# Patient Record
Sex: Female | Born: 1955
Health system: Southern US, Community
[De-identification: ages and names within clinical notes are randomized; demographics above are authoritative.]

## PROBLEM LIST (undated history)

## (undated) ENCOUNTER — Emergency Department (HOSPITAL_COMMUNITY): Discharge status: Absent without leave | Payer: Self-pay

## (undated) DIAGNOSIS — F988 Other specified behavioral and emotional disorders with onset usually occurring in childhood and adolescence: Secondary | ICD-10-CM

## (undated) DIAGNOSIS — B977 Papillomavirus as the cause of diseases classified elsewhere: Secondary | ICD-10-CM

## (undated) DIAGNOSIS — M1990 Unspecified osteoarthritis, unspecified site: Secondary | ICD-10-CM

## (undated) DIAGNOSIS — N390 Urinary tract infection, site not specified: Secondary | ICD-10-CM

## (undated) HISTORY — DX: Papillomavirus as the cause of diseases classified elsewhere: B97.7

## (undated) HISTORY — PX: ABDOMINAL HYSTERECTOMY: SHX81

## (undated) HISTORY — PX: NASAL SINUS SURGERY: SHX719

## (undated) HISTORY — PX: COMBINED HYSTEROSCOPY DIAGNOSTIC / D&C: SUR297

## (undated) HISTORY — PX: NASAL SEPTOPLASTY W/ TURBINOPLASTY: SHX2070

## (undated) HISTORY — DX: Unspecified osteoarthritis, unspecified site: M19.90

## (undated) HISTORY — DX: Other specified behavioral and emotional disorders with onset usually occurring in childhood and adolescence: F98.8

## (undated) HISTORY — PX: COLONOSCOPY: SHX174

---

## 1984-03-01 HISTORY — PX: TOTAL VAGINAL HYSTERECTOMY: SHX2548

## 1984-03-01 HISTORY — PX: ABDOMINAL HYSTERECTOMY: SHX81

## 1990-03-01 HISTORY — PX: NASAL SINUS SURGERY: SHX719

## 2015-05-30 ENCOUNTER — Emergency Department (INDEPENDENT_AMBULATORY_CARE_PROVIDER_SITE_OTHER)
Admission: EM | Admit: 2015-05-30 | Discharge: 2015-05-30 | Disposition: A | Discharge status: Home / Self Care | Payer: BLUE CROSS/BLUE SHIELD | Source: Home / Self Care | Attending: Emergency Medicine | Admitting: Emergency Medicine

## 2015-05-30 ENCOUNTER — Encounter (HOSPITAL_COMMUNITY): Payer: Self-pay | Admitting: Emergency Medicine

## 2015-05-30 DIAGNOSIS — N390 Urinary tract infection, site not specified: Secondary | ICD-10-CM | POA: Diagnosis not present

## 2015-05-30 HISTORY — DX: Urinary tract infection, site not specified: N39.0

## 2015-05-30 LAB — POCT URINALYSIS DIP (DEVICE)
Bilirubin Urine: NEGATIVE
Glucose, UA: NEGATIVE mg/dL
Ketones, ur: NEGATIVE mg/dL
NITRITE: POSITIVE — AB
PH: 6 (ref 5.0–8.0)
Specific Gravity, Urine: >=1.03 (ref 1.005–1.030)
Urobilinogen, UA: 0.2 mg/dL (ref 0.0–1.0)

## 2015-05-30 MED ORDER — CEPHALEXIN 500 MG PO CAPS: 500.0000 mg | ORAL_CAPSULE | Freq: Four times a day (QID) | ORAL | Status: DC

## 2015-05-30 NOTE — ED Notes (Signed)
Onset of symptoms 3/25.  Patient has a history of uti.  Low abdominal pain.  Feeling bladder full, but only a small amount of urine.  Initially right low back pain , now bilateral back pain.  Burning with urination. Pelvic pain has worsened

## 2015-05-30 NOTE — Discharge Instructions (Signed)
Antibiotic Medicine °Antibiotic medicines are used to treat infections caused by bacteria. They work by injuring or killing the bacteria that is making you sick. °HOW IS AN ANTIBIOTIC CHOSEN? °An antibiotic is chosen based on many factors. To help your health care provider choose one for you, tell your health care provider if: °· You have any allergies. °· You are pregnant or plan to get pregnant. °· You are breastfeeding. °· You are taking any medicines. These include over-the-counter medicines, prescription medicines, and herbal remedies. °· You have a medical condition or problem you have not already discussed. °Your health care provider will also consider: °· How often the medicine has to be taken. °· Common side effects of the medicine. °· The cost of the medicine. °· The taste of the medicine. °If you have questions about why an antibiotic was chosen, make sure to ask. °FOR HOW LONG SHOULD I TAKE MY ANTIBIOTIC? °Continue to take your antibiotic for as long as told by your health care provider. Do not stop taking it when you feel better. If you stop taking it too soon: °· You may start to feel sick again. °· Your infection may become harder to treat. °· Complications may develop. °WHAT IF I MISS A DOSE? °Try not to miss any doses of medicine. If you miss a dose, take it as soon as possible. However, if it is almost time for the next dose: °· If you are taking 2 doses per day, take the missed dose and the next dose 5 to 6 hours apart. °· If you are taking 3 or more doses per day, take the missed dose and the next dose 2 to 4 hours apart, then go back to the normal schedule. °If you cannot make up a missed dose, take the next scheduled dose on time. Then take the missed dose after you have taken all the doses as recommended by your health care provider, as if you had one more dose left. °DO ANTIBIOTICS AFFECT BIRTH CONTROL? °Birth control pills may not work while you are on antibiotics. If you are taking birth  control pills, continue taking them as usual and use a second form of birth control, such as a condom, to avoid unwanted pregnancy. Continue using the second form of birth control until you are finished with your current 1 month cycle of birth control pills. °OTHER INFORMATION °· If there is any medicine left over, throw it away. °· Never take someone else's antibiotics. °· Never take leftover antibiotics. °SEEK MEDICAL CARE IF: °· You get worse. °· You do not feel better within a few days of starting the antibiotic medicine. °· You vomit. °· White patches appear in your mouth. °· You have new joint pain that begins after starting the antibiotic. °· You have new muscle aches that begin after starting the antibiotic. °· You had a fever before starting the antibiotic and it returns. °· You have any symptoms of an allergic reaction, such as an itchy rash. If this happens, stop taking the antibiotic. °SEEK IMMEDIATE MEDICAL CARE IF: °· Your urine turns dark or becomes blood-colored. °· Your skin turns yellow. °· You bruise or bleed easily. °· You have severe diarrhea and abdominal cramps. °· You have a severe headache. °· You have signs of a severe allergic reaction, such as: °¨ Trouble breathing. °¨ Wheezing. °¨ Swelling of the lips, tongue, or face. °¨ Fainting. °¨ Blisters on the skin or in the mouth. °If you have signs of a severe allergic   reaction, stop taking the antibiotic right away. °  °This information is not intended to replace advice given to you by your health care provider. Make sure you discuss any questions you have with your health care provider. °  °Document Released: 10/29/2003 Document Revised: 11/06/2014 Document Reviewed: 07/03/2014 °Elsevier Interactive Patient Education ©2016 [employer] ° °Urinary Tract Infection °Urinary tract infections (UTIs) can develop anywhere along your urinary tract. Your urinary tract is your body's drainage system for removing wastes and extra water. Your urinary  tract includes two kidneys, two ureters, a bladder, and a urethra. Your kidneys are a pair of bean-shaped organs. Each kidney is about the size of your fist. They are located below your ribs, one on each side of your spine. °CAUSES °Infections are caused by microbes, which are microscopic organisms, including fungi, viruses, and bacteria. These organisms are so small that they can only be seen through a microscope. Bacteria are the microbes that most commonly cause UTIs. °SYMPTOMS  °Symptoms of UTIs may vary by age and gender of the patient and by the location of the infection. Symptoms in young women typically include a frequent and intense urge to urinate and a painful, burning feeling in the bladder or urethra during urination. Older women and men are more likely to be tired, shaky, and weak and have muscle aches and abdominal pain. A fever may mean the infection is in your kidneys. Other symptoms of a kidney infection include pain in your back or sides below the ribs, nausea, and vomiting. °DIAGNOSIS °To diagnose a UTI, your caregiver will ask you about your symptoms. Your caregiver will also ask you to provide a urine sample. The urine sample will be tested for bacteria and white blood cells. White blood cells are made by your body to help fight infection. °TREATMENT  °Typically, UTIs can be treated with medication. Because most UTIs are caused by a bacterial infection, they usually can be treated with the use of antibiotics. The choice of antibiotic and length of treatment depend on your symptoms and the type of bacteria causing your infection. °HOME CARE INSTRUCTIONS °· If you were prescribed antibiotics, take them exactly as your caregiver instructs you. Finish the medication even if you feel better after you have only taken some of the medication. °· Drink enough water and fluids to keep your urine clear or pale yellow. °· Avoid caffeine, tea, and carbonated beverages. They tend to irritate your  bladder. °· Empty your bladder often. Avoid holding urine for long periods of time. °· Empty your bladder before and after sexual intercourse. °· After a bowel movement, women should cleanse from front to back. Use each tissue only once. °SEEK MEDICAL CARE IF:  °· You have back pain. °· You develop a fever. °· Your symptoms do not begin to resolve within 3 days. °SEEK IMMEDIATE MEDICAL CARE IF:  °· You have severe back pain or lower abdominal pain. °· You develop chills. °· You have nausea or vomiting. °· You have continued burning or discomfort with urination. °MAKE SURE YOU:  °· Understand these instructions. °· Will watch your condition. °· Will get help right away if you are not doing well or get worse. °  °This information is not intended to replace advice given to you by your health care provider. Make sure you discuss any questions you have with your health care provider. °  °Document Released: 11/25/2004 Document Revised: 11/06/2014 Document Reviewed: 03/26/2011 °Elsevier Interactive Patient Education ©2016 [employer] Inc[employer] ° °

## 2015-05-30 NOTE — ED Provider Notes (Signed)
CSN: [id number]     Arrival date & time 05/30/15  1307 History   First MD Initiated Contact with Patient 05/30/15 1442     Chief Complaint  Patient presents with  . Urinary Tract Infection   (Consider location/radiation/quality/duration/timing/severity/associated sxs/prior Treatment) HPI Comments: 60 year old female complaining of a six-day history of urinary symptoms. Initially started with poor urinary flow and interrupted strain. Symptoms developing over the next few days include dysuria, suprapubic pain, urinary frequency, urgency. She also has very low back pain. She points to the superior aspect of the bilateral hips as a source of pain. Denies fever but she has had a couple days of nausea and vomiting with today being one of them.   Past Medical History  Diagnosis Date  . UTI (lower urinary tract infection)    Past Surgical History  Procedure Laterality Date  . Abdominal hysterectomy    . Nasal sinus surgery     No family history on file. Social History  Substance Use Topics  . Smoking status: Never Smoker   . Smokeless tobacco: None  . Alcohol Use: Yes   OB History    No data available     Review of Systems  Constitutional: Positive for chills, activity change and appetite change. Negative for fever.  HENT: Negative.   Respiratory: Negative.   Cardiovascular: Negative.   Gastrointestinal: Positive for nausea and vomiting.  Genitourinary: Positive for dysuria, urgency, frequency and hematuria. Negative for flank pain and vaginal discharge.       Suprapubic pain  Musculoskeletal: Positive for back pain.  Neurological: Negative.     Allergies  Review of patient's allergies indicates no known allergies.  Home Medications   Prior to Admission medications   Medication Sig Start Date End Date Taking? Authorizing Provider  cephALEXin (KEFLEX) 500 MG capsule Take 1 capsule (500 mg total) by mouth 4 (four) times daily. 05/30/15   Janne Napoleon, NP   Meds Ordered and  Administered this Visit  Medications - No data to display  BP 121/89 mmHg  Pulse 89  Temp(Src) 98 F (36.7 C) (Oral)  Resp 12  SpO2 98% No data found.   Physical Exam  Constitutional: She is oriented to person, place, and time. She appears well-developed and well-nourished. No distress.  Eyes: Conjunctivae and EOM are normal.  Neck: Normal range of motion. Neck supple.  Cardiovascular: Normal rate, regular rhythm and normal heart sounds.   Pulmonary/Chest: Effort normal and breath sounds normal. No respiratory distress.  Abdominal: Soft. There is no tenderness.  No tenderness across the abdomen, lower abdomen or suprapubic areas.  Musculoskeletal: She exhibits tenderness. She exhibits no edema.  Tenderness to the musculature over the posterior hips. The musculature involves the gluteus medius and maximus primarily.  Neurological: She is alert and oriented to person, place, and time. No cranial nerve deficit. She exhibits normal muscle tone.  Skin: Skin is warm and dry. No erythema.  Psychiatric: She has a normal mood and affect.  Nursing note and vitals reviewed.   ED Course  Procedures (including critical care time)  Labs Review Labs Reviewed  POCT URINALYSIS DIP (DEVICE) - Abnormal; Notable for the following:    Hgb urine dipstick LARGE (*)    Protein, ur >=300 (*)    Nitrite POSITIVE (*)    Leukocytes, UA LARGE (*)    All other components within normal limits   Results for orders placed or performed during the hospital encounter of 05/30/15  POCT urinalysis dip (device)  Result  Value Ref Range   Glucose, UA NEGATIVE NEGATIVE mg/dL   Bilirubin Urine NEGATIVE NEGATIVE   Ketones, ur NEGATIVE NEGATIVE mg/dL   Specific Gravity, Urine >=1.030 1.005 - 1.030   Hgb urine dipstick LARGE (A) NEGATIVE   pH 6.0 5.0 - 8.0   Protein, ur >=300 (A) NEGATIVE mg/dL   Urobilinogen, UA 0.2 0.0 - 1.0 mg/dL   Nitrite POSITIVE (A) NEGATIVE   Leukocytes, UA LARGE (A) NEGATIVE      Imaging Review No results found.   Visual Acuity Review  Right Eye Distance:   Left Eye Distance:   Bilateral Distance:    Right Eye Near:   Left Eye Near:    Bilateral Near:         MDM   1. UTI (lower urinary tract infection)     If you were prescribed antibiotics, take them exactly as your caregiver instructs you. Finish the medication even if you feel better after you have only taken some of the medication.  Drink enough water and fluids to keep your urine clear or pale yellow.  Avoid caffeine, tea, and carbonated beverages. They tend to irritate your bladder.  Empty your bladder often. Avoid holding urine for long periods of time.  Empty your bladder before and after sexual intercourse.  After a bowel movement, women should cleanse from front to back. Use each tissue only once. SEEK MEDICAL CARE IF:   You have back pain.  You develop a fever.  Your symptoms do not begin to resolve within 3 days. SEEK IMMEDIATE MEDICAL CARE IF:   You have severe back pain or lower abdominal pain.  You develop chills.  You have nausea or vomiting.  You have continued burning or discomfort with urination.  Meds ordered this encounter  Medications  . cephALEXin (KEFLEX) 500 MG capsule    Sig: Take 1 capsule (500 mg total) by mouth 4 (four) times daily.    Dispense:  28 capsule    Refill:  0    Order Specific Question:  Supervising Provider    Answer:  Melony Overly [phone]     Patient refused to have a urine culture due to cost.    Janne Napoleon, NP 05/30/15 1501

## 2017-04-27 DIAGNOSIS — Z7189 Other specified counseling: Secondary | ICD-10-CM | POA: Diagnosis not present

## 2017-04-27 DIAGNOSIS — Z23 Encounter for immunization: Secondary | ICD-10-CM | POA: Diagnosis not present

## 2017-10-14 DIAGNOSIS — H52223 Regular astigmatism, bilateral: Secondary | ICD-10-CM | POA: Diagnosis not present

## 2017-10-14 DIAGNOSIS — H524 Presbyopia: Secondary | ICD-10-CM | POA: Diagnosis not present

## 2017-10-14 DIAGNOSIS — H5213 Myopia, bilateral: Secondary | ICD-10-CM | POA: Diagnosis not present

## 2017-11-25 ENCOUNTER — Encounter: Payer: Self-pay | Admitting: Emergency Medicine

## 2017-11-25 ENCOUNTER — Emergency Department
Admission: EM | Admit: 2017-11-25 | Discharge: 2017-11-25 | Disposition: A | Discharge status: Home / Self Care | Payer: BLUE CROSS/BLUE SHIELD | Attending: Emergency Medicine | Admitting: Emergency Medicine

## 2017-11-25 ENCOUNTER — Emergency Department: Payer: BLUE CROSS/BLUE SHIELD

## 2017-11-25 ENCOUNTER — Other Ambulatory Visit: Payer: Self-pay

## 2017-11-25 DIAGNOSIS — M1712 Unilateral primary osteoarthritis, left knee: Secondary | ICD-10-CM | POA: Diagnosis not present

## 2017-11-25 DIAGNOSIS — M25462 Effusion, left knee: Secondary | ICD-10-CM

## 2017-11-25 DIAGNOSIS — M25562 Pain in left knee: Secondary | ICD-10-CM

## 2017-11-25 DIAGNOSIS — M25552 Pain in left hip: Secondary | ICD-10-CM | POA: Diagnosis not present

## 2017-11-25 NOTE — ED Notes (Addendum)
Pt unable to bear weight on right leg without causing intense pain. Pt felt popping sensation in knee when shopping. Pt able to stand and pivot onto stretcher from wheelchair. Some swelling visible on the outside of left knee, no bruising to the area

## 2017-11-25 NOTE — ED Triage Notes (Signed)
Pt presents to ED via POV with c/o L knee pain that started yesterday morning. Pt states went shopping earlier today and was standing in line when "it felt like a firecracker went off in my knee". Pt states walking/movement makes pain worse. Pt states dx with arthritis in R knee but not L knee.

## 2017-11-25 NOTE — ED Provider Notes (Signed)
[facility] Provider Note   CSN: [id number] Arrival date & time: 11/25/17  1615     History   Chief Complaint Chief Complaint  Patient presents with  . Knee Pain    HPI Caitlyn Thomas is a 62 y.o. female presents to the emergency department for evaluation of left knee pain.  Patient states earlier today she was shopping when she felt a pop in her left knee along the medial joint line.  She developed some instant pain and swelling throughout the knee but no pain or swelling throughout the lower leg.  Patient states since feeling the pop she feels as if the knee is unstable she has no pain with rest and moderate pain with attempted weightbearing.  She has not had x-rays of her left knee but no she is bone-on-bone in the right knee.  She admits to having some intermittent episodes of groin pain but no numbness tingling or radicular symptoms.  She is able to straight leg raise.  She has taken some ibuprofen with mild relief of her pain with weightbearing but still pain is moderate with standing.  Pain is sharp and located along the medial and posterior aspect of the knee.   HPI  Past Medical History:  Diagnosis Date  . UTI (lower urinary tract infection)     There are no active problems to display for this patient.   Past Surgical History:  Procedure Laterality Date  . ABDOMINAL HYSTERECTOMY    . NASAL SINUS SURGERY       OB History   None      Home Medications    Prior to Admission medications   Medication Sig Start Date End Date Taking? Authorizing Provider  cephALEXin (KEFLEX) 500 MG capsule Take 1 capsule (500 mg total) by mouth 4 (four) times daily. 05/30/15   Janne Napoleon, NP    Family History History reviewed. No pertinent family history.  Social History Social History   Tobacco Use  . Smoking status: Never Smoker  . Smokeless tobacco: Never Used  Substance Use Topics  . Alcohol use: Yes  . Drug use: No      Allergies   Patient has no known allergies.   Review of Systems Review of Systems  Constitutional: Negative for fever.  Respiratory: Negative for shortness of breath.   Cardiovascular: Negative for chest pain.  Gastrointestinal: Negative for abdominal pain.  Genitourinary: Negative for difficulty urinating, dysuria and urgency.  Musculoskeletal: Positive for arthralgias, gait problem and joint swelling. Negative for back pain and myalgias.  Skin: Negative for rash.  Neurological: Negative for dizziness and headaches.     Physical Exam Updated Vital Signs BP (!) 154/93 (BP Location: Right Arm)   Pulse 69   Temp 97.8 F (36.6 C) (Oral)   Resp 20   Ht 5\' 9"  (1.753 m)   Wt 108.9 kg   SpO2 97%   BMI 35.44 kg/m   Physical Exam  Constitutional: She is oriented to person, place, and time. She appears well-developed and well-nourished.  HENT:  Head: Normocephalic and atraumatic.  Eyes: Conjunctivae are normal.  Neck: Normal range of motion.  Cardiovascular: Normal rate.  Pulmonary/Chest: Effort normal. No respiratory distress.  Musculoskeletal: Normal range of motion.  Left lower extremity shows good range of motion of the hip with internal and external rotation with very mild groin pain.  She has no stiffness with hip internal and external rotation.  She has a negative straight leg raise test.  She is able to straight leg raise at the knee with no palpable defects in the quad or patellar tendon.  Knee is stable to valgus and varus stress testing but pain with varus stress testing.  She has pain with flexion past 90 degrees, mild effusion noted.  Small Baker's cyst noted.  She has a positive McMurray's test reproducing severe pain.  She has no tenderness throughout the calf with no swelling warmth erythema or edema in the lower extremity.  Neurological: She is alert and oriented to person, place, and time.  Skin: Skin is warm. No rash noted.  Psychiatric: She has a normal  mood and affect. Her behavior is normal. Thought content normal.     ED Treatments / Results  Labs (all labs ordered are listed, but only abnormal results are displayed) Labs Reviewed - No data to display  EKG None  Radiology Dg Knee Complete 4 Views Left  Result Date: 11/25/2017 CLINICAL DATA:  Pain.  No focal trauma reported. EXAM: LEFT KNEE - COMPLETE 4+ VIEW COMPARISON:  None. FINDINGS: Small joint effusion. Mild high attenuation projected over the suprapatellar space on the lateral view. Enthesopathic changes off the inferior patella. No acute fractures identified. Tricompartmental degenerative changes are mild-to-moderate. IMPRESSION: 1. Mild to moderate tricompartmental degenerative changes. 2. Small joint effusion. 3. Ill-defined high attenuation the suprapatellar space likely represent soft tissue calcification. Electronically Signed   By: Dorise Bullion III M.D   On: 11/25/2017 18:02   Dg Hip Unilat W Or Wo Pelvis 2-3 Views Left  Result Date: 11/25/2017 CLINICAL DATA:  Pain.  No focal trauma reported. EXAM: DG HIP (WITH OR WITHOUT PELVIS) 2-3V LEFT COMPARISON:  None. FINDINGS: There is no evidence of hip fracture or dislocation. There is no evidence of arthropathy or other focal bone abnormality. IMPRESSION: Negative. Electronically Signed   By: Dorise Bullion III M.D   On: 11/25/2017 18:03    Procedures Procedures (including critical care time)  Medications Ordered in ED Medications - No data to display   Initial Impression / Assessment and Plan / ED Course  I have reviewed the triage vital signs and the nursing notes.  Pertinent labs & imaging results that were available during my care of the patient were reviewed by me and considered in my medical decision making (see chart for details).     62 year old female with acute left knee pain.  She has a mild effusion with signs concerning for possible internal derangement.  Nonweightbearing x-rays show tricompartmental  osteoarthritis.  No sign of ligamentous laxity or tendon rupture.  No sign of DVT.  Patient is given Ace wrap for compression, she will rest ice and elevate.  She is given crutches to help with ambulation.  She will call orthopedic office Monday morning to schedule follow-up appointment.  Final Clinical Impressions(s) / ED Diagnoses   Final diagnoses:  Effusion of left knee  Primary osteoarthritis of left knee  Mechanical pain of left knee    ED Discharge Orders    None       Renata Caprice 11/25/17 1847    Delman Kitten, MD 11/26/17 [phone]

## 2017-11-25 NOTE — Discharge Instructions (Addendum)
Please rest ice and elevate the left knee.  Use Ace wrap as needed.  Please use crutches as needed for ambulation.  You may take Tylenol and/or ibuprofen as needed for pain.  Call orthopedic office Monday morning schedule follow-up appointment.

## 2017-12-07 DIAGNOSIS — M25562 Pain in left knee: Secondary | ICD-10-CM | POA: Diagnosis not present

## 2018-01-05 DIAGNOSIS — M25562 Pain in left knee: Secondary | ICD-10-CM | POA: Diagnosis not present

## 2018-01-13 DIAGNOSIS — M25562 Pain in left knee: Secondary | ICD-10-CM | POA: Diagnosis not present

## 2018-01-20 DIAGNOSIS — M25562 Pain in left knee: Secondary | ICD-10-CM | POA: Diagnosis not present

## 2018-01-29 HISTORY — PX: REPLACEMENT TOTAL KNEE: SUR1224

## 2018-02-09 DIAGNOSIS — M25562 Pain in left knee: Secondary | ICD-10-CM | POA: Diagnosis not present

## 2018-02-09 DIAGNOSIS — Z01818 Encounter for other preprocedural examination: Secondary | ICD-10-CM | POA: Diagnosis not present

## 2018-02-09 DIAGNOSIS — M1712 Unilateral primary osteoarthritis, left knee: Secondary | ICD-10-CM | POA: Diagnosis not present

## 2018-02-10 DIAGNOSIS — M1712 Unilateral primary osteoarthritis, left knee: Secondary | ICD-10-CM | POA: Diagnosis not present

## 2018-02-10 DIAGNOSIS — Z01818 Encounter for other preprocedural examination: Secondary | ICD-10-CM | POA: Diagnosis not present

## 2018-02-10 DIAGNOSIS — K219 Gastro-esophageal reflux disease without esophagitis: Secondary | ICD-10-CM | POA: Diagnosis not present

## 2018-02-10 DIAGNOSIS — R9431 Abnormal electrocardiogram [ECG] [EKG]: Secondary | ICD-10-CM | POA: Diagnosis not present

## 2018-02-23 DIAGNOSIS — G8918 Other acute postprocedural pain: Secondary | ICD-10-CM | POA: Diagnosis not present

## 2018-02-23 DIAGNOSIS — Z79899 Other long term (current) drug therapy: Secondary | ICD-10-CM | POA: Diagnosis not present

## 2018-02-23 DIAGNOSIS — M1712 Unilateral primary osteoarthritis, left knee: Secondary | ICD-10-CM | POA: Diagnosis not present

## 2018-02-23 DIAGNOSIS — K219 Gastro-esophageal reflux disease without esophagitis: Secondary | ICD-10-CM | POA: Diagnosis not present

## 2018-02-23 DIAGNOSIS — Z96652 Presence of left artificial knee joint: Secondary | ICD-10-CM | POA: Diagnosis not present

## 2018-02-23 DIAGNOSIS — Z6835 Body mass index (BMI) 35.0-35.9, adult: Secondary | ICD-10-CM | POA: Diagnosis not present

## 2018-02-23 DIAGNOSIS — E669 Obesity, unspecified: Secondary | ICD-10-CM | POA: Diagnosis not present

## 2018-02-23 DIAGNOSIS — Z471 Aftercare following joint replacement surgery: Secondary | ICD-10-CM | POA: Diagnosis not present

## 2018-02-23 DIAGNOSIS — Z87891 Personal history of nicotine dependence: Secondary | ICD-10-CM | POA: Diagnosis not present

## 2018-02-27 DIAGNOSIS — M25562 Pain in left knee: Secondary | ICD-10-CM | POA: Diagnosis not present

## 2018-03-03 DIAGNOSIS — M25562 Pain in left knee: Secondary | ICD-10-CM | POA: Diagnosis not present

## 2018-03-06 DIAGNOSIS — M25562 Pain in left knee: Secondary | ICD-10-CM | POA: Diagnosis not present

## 2018-03-09 DIAGNOSIS — M25562 Pain in left knee: Secondary | ICD-10-CM | POA: Diagnosis not present

## 2018-03-13 DIAGNOSIS — M25562 Pain in left knee: Secondary | ICD-10-CM | POA: Diagnosis not present

## 2018-03-16 DIAGNOSIS — M25562 Pain in left knee: Secondary | ICD-10-CM | POA: Diagnosis not present

## 2018-03-20 DIAGNOSIS — M25562 Pain in left knee: Secondary | ICD-10-CM | POA: Diagnosis not present

## 2018-03-23 DIAGNOSIS — M25562 Pain in left knee: Secondary | ICD-10-CM | POA: Diagnosis not present

## 2018-03-27 DIAGNOSIS — M25562 Pain in left knee: Secondary | ICD-10-CM | POA: Diagnosis not present

## 2018-04-11 DIAGNOSIS — M25562 Pain in left knee: Secondary | ICD-10-CM | POA: Diagnosis not present

## 2018-04-18 DIAGNOSIS — M25562 Pain in left knee: Secondary | ICD-10-CM | POA: Diagnosis not present

## 2018-08-16 ENCOUNTER — Encounter: Payer: Self-pay | Admitting: Osteopathic Medicine

## 2018-08-16 ENCOUNTER — Ambulatory Visit (INDEPENDENT_AMBULATORY_CARE_PROVIDER_SITE_OTHER): Payer: BC Managed Care – PPO | Admitting: Osteopathic Medicine

## 2018-08-16 VITALS — BP 140/85 | HR 72 | Temp 98.2°F | Ht 70.0 in | Wt 248.3 lb

## 2018-08-16 DIAGNOSIS — Z23 Encounter for immunization: Secondary | ICD-10-CM | POA: Diagnosis not present

## 2018-08-16 DIAGNOSIS — Z8659 Personal history of other mental and behavioral disorders: Secondary | ICD-10-CM | POA: Diagnosis not present

## 2018-08-16 DIAGNOSIS — L719 Rosacea, unspecified: Secondary | ICD-10-CM | POA: Diagnosis not present

## 2018-08-16 DIAGNOSIS — L501 Idiopathic urticaria: Secondary | ICD-10-CM

## 2018-08-16 DIAGNOSIS — R030 Elevated blood-pressure reading, without diagnosis of hypertension: Secondary | ICD-10-CM | POA: Insufficient documentation

## 2018-08-16 DIAGNOSIS — Z1239 Encounter for other screening for malignant neoplasm of breast: Secondary | ICD-10-CM

## 2018-08-16 DIAGNOSIS — R635 Abnormal weight gain: Secondary | ICD-10-CM | POA: Insufficient documentation

## 2018-08-16 MED ORDER — METRONIDAZOLE 0.75 % EX GEL: 1.0000 "application " | Freq: Two times a day (BID) | CUTANEOUS | 3 refills | Status: DC

## 2018-08-16 MED ORDER — BUPROPION HCL ER (XL) 150 MG PO TB24: 150.0000 mg | ORAL_TABLET | Freq: Every day | ORAL | 1 refills | Status: DC

## 2018-08-16 NOTE — Patient Instructions (Addendum)
Plan:  Refilled Metrogel  Will trial Wellbutrin for weight, ADD   Heartburn: trial Tums / Rolaids / Pepto-Bismol as needed   Itching: trial antihistamine (Zyrtec / Allegra / Claritin) or steroid (Hydrocortisone) as needed   Will get mammogram  Plan to return for nurse visit to verify home blood pressure cuff. In the meantime, be keeping a record of you rblood pressures at home and bring this with you to the visit with the nurse. If your cuff is measuring within 5-10 points of ours AND your home numbers are less than 140/90 (ideally less than 130/80) then nothing else to do. If your home blood pressure cuff is inaccurate or is accurate but measuring above goal, we will need to talk about adjusting your medications.      Weight loss: important things to remember  It is hard work! You will have setbacks, but don't get discouraged. The goal is not short-term success, it is long-term health.   Looking at the numbers is important to track your progress and set goals, but how you are feeling and your overall health are the most important things! BMI and pounds and calories and miles logged aren't everything - they are tools to help [place] reach your goals.  You can do this!!!   Things to remember for exercise for weight loss:   Please note - I am not a certified personal trainer. I can present you with ideas and general workout goals, but an exercise program is largely up to you. Find something you can stick with, and something you enjoy!   As you progress in your exercise regimen think about gradually increasing the following, week by week:   intensity (how strenuous is your workout)  frequency (how often you are exercising)  duration (how many minutes at a time you are exercising)  Walking for 20 minutes a day is certainly better than nothing, but more strenuous exercise will develop better cardiovascular fitness.   interval training (high-intensity alternating with low-intensity, think  walk/jog rather than just walk)  muscle strengthening exercises (weight lifting, calisthenics, yoga) - this also helps prevent osteoporosis!   Things to remember for diet changes for weight loss:   Please note - I am not a certified dietician. I can present you with ideas and general diet goals, but a meal plan is largely up to you. I am happy to refer you to a dietician who can give you a detailed meal plan.  Apps/logs are crucial to track how you're eating! It's not realistic to be logging everything you eat forever, but when you're starting a healthy eating lifestyle it's very helpful, and checking in with logs now and then helps you stick to your program!   Calorie restriction with the goal weight loss of no more than one to one and a half pounds per week.   Increase lean protein such as chicken, fish, [place].   Decrease fatty foods such as dairy, butter.   Decrease sugary foods. Avoid sugary drinks such as soda or juice.  Increase fiber found in fruit and vegetables.

## 2018-08-16 NOTE — Progress Notes (Signed)
HPI: Caitlyn Thomas is a 63 y.o. female who  has a past medical history of UTI (lower urinary tract infection).  she presents to [facility] today, 08/16/18,  for chief complaint of: New to establish - see headings below  Very pleasant new patient here to establish care.  Actually moved here from [place], she grew up in the town where I went to college!   Rosacea: Has done well on MetroGel, would like refill of this  Heartburn: Occasional acid reflux, especially if drinking too much wine.  She will occasionally take her husband's Protonix.  She does not take any other over-the-counter medications.  Weight gain: Patient has not tried much in the way of exercise or dietary restrictions as far as weight loss is concerned and is not interested in medications.  She would like to discuss healthy lifestyle strategies.  Itching: typically at ankles and arms, no significant rash though occasionally she will notice what she describes as white spots that pop up on the areas where she is itchy.  Not take any medication for this other than Benadryl on occasion.  Elevated blood pressure: This is something that she says has been mentioned to her a couple of times over the past year.  She has a home blood pressure monitor that she very rarely uses.  History of ADHD: Is reluctant to be on medications but is finding that she is really struggling to stay focused especially with working from home, finds noise very distracting, is also distracted by the presence of her husband.  Former tobacco use, quit 35 years ago.  Sometimes alcohol consumption, former marijuana user.  Status post hysterectomy, last mammogram was in 2010.  G2, P2     .   Past medical, surgical, social and family history reviewed:  Patient Active Problem List   Diagnosis Date Noted  . Need for varicella vaccine 08/16/2018  . History of ADHD 08/16/2018  . Rosacea 08/16/2018  . Weight gain  08/16/2018  . Idiopathic urticaria 08/16/2018  . Elevated blood pressure reading 08/16/2018    Past Surgical History:  Procedure Laterality Date  . ABDOMINAL HYSTERECTOMY  1986  . NASAL SINUS SURGERY  1992  . REPLACEMENT TOTAL KNEE Left 01/2017    Social History   Tobacco Use  . Smoking status: Former Smoker    Types: Cigarettes    Quit date: 1985    Years since quitting: 35.4  . Smokeless tobacco: Never Used  Substance Use Topics  . Alcohol use: Yes    No family history on file.   Current medication list and allergy/intolerance information reviewed:    No meds   No Known Allergies    Review of Systems:  Constitutional:  No  fever, no chills, No recent illness, +unintentional weight changes. No significant fatigue.   HEENT: No  headache, no vision change, no hearing change, No sore throat, No  sinus pressure  Cardiac: No  chest pain, No  pressure, No palpitations, No  Orthopnea  Respiratory:  No  shortness of breath. No  Cough  Gastrointestinal: No  abdominal pain, No  nausea, No  vomiting,  No  blood in stool, No  diarrhea, No  constipation   Musculoskeletal: No new myalgia/arthralgia  Skin: No  Rash, No other wounds/concerning lesions  Genitourinary: No  incontinence, No  abnormal genital bleeding, No abnormal genital discharge  Hem/Onc: No  easy bruising/bleeding, No  abnormal lymph node  Endocrine: No cold intolerance,  No heat intolerance.  No polyuria/polydipsia/polyphagia   Neurologic: No  weakness, No  dizziness, No  slurred speech/focal weakness/facial droop  Psychiatric: No  concerns with depression, No  concerns with anxiety, No sleep problems, No mood problems  Exam:  BP 140/85 (BP Location: Left Arm, Patient Position: Sitting, Cuff Size: Normal)   Pulse 72   Temp 98.2 F (36.8 C) (Oral)   Ht 5\' 10"  (1.778 m)   Wt 248 lb 4.8 oz (112.6 kg)   BMI 35.63 kg/m   Constitutional: VS see above. General Appearance: alert, well-developed,  well-nourished, NAD  Eyes: Normal lids and conjunctive, non-icteric sclera  Neck: No masses, trachea midline. No thyroid enlargement. No tenderness/mass appreciated. No lymphadenopathy  Respiratory: Normal respiratory effort. no wheeze, no rhonchi, no rales  Cardiovascular: S1/S2 normal, no murmur, no rub/gallop auscultated. RRR. No lower extremity edema.   Gastrointestinal: Nontender, no masses. No hepatomegaly, no splenomegaly. No hernia appreciated. Bowel sounds normal. Rectal exam deferred.   Musculoskeletal: Gait normal. No clubbing/cyanosis of digits.   Neurological: Normal balance/coordination. No tremor. No cranial nerve deficit on limited exam. Motor and sensation intact and symmetric. Cerebellar reflexes intact.   Skin: warm, dry, intact. No rash/ulcer. No concerning nevi or subq nodules on limited exam.    Psychiatric: Normal judgment/insight. Normal mood and affect. Oriented x3.      ASSESSMENT/PLAN: The primary encounter diagnosis was Breast cancer screening. Diagnoses of Need for varicella vaccine, History of ADHD, Rosacea, Weight gain, Elevated blood pressure reading, and Idiopathic urticaria were also pertinent to this visit.   Orders Placed This Encounter  Procedures  . MM 3D SCREEN BREAST BILATERAL  . Varicella-zoster vaccine IM (Shingrix)    Meds ordered this encounter  Medications  . metroNIDAZOLE (METROGEL) 0.75 % gel    Sig: Apply 1 application topically 2 (two) times daily.    Dispense:  45 g    Refill:  3  . buPROPion (WELLBUTRIN XL) 150 MG 24 hr tablet    Sig: Take 1 tablet (150 mg total) by mouth daily.    Dispense:  90 tablet    Refill:  1    Patient Instructions  Plan:  Refilled Metrogel  Will trial Wellbutrin for weight, ADD   Heartburn: trial Tums / Rolaids / Pepto-Bismol as needed   Itching: trial antihistamine (Zyrtec / Allegra / Claritin) or steroid (Hydrocortisone) as needed   Will get mammogram  Plan to return for nurse visit  to verify home blood pressure cuff. In the meantime, be keeping a record of you rblood pressures at home and bring this with you to the visit with the nurse. If your cuff is measuring within 5-10 points of ours AND your home numbers are less than 140/90 (ideally less than 130/80) then nothing else to do. If your home blood pressure cuff is inaccurate or is accurate but measuring above goal, we will need to talk about adjusting your medications.      Weight loss: important things to remember  It is hard work! You will have setbacks, but don't get discouraged. The goal is not short-term success, it is long-term health.   Looking at the numbers is important to track your progress and set goals, but how you are feeling and your overall health are the most important things! BMI and pounds and calories and miles logged aren't everything - they are tools to help [place] reach your goals.  You can do this!!!   Things to remember for exercise for weight loss:   Please note -  I am not a Administrator, arts. I can present you with ideas and general workout goals, but an exercise program is largely up to you. Find something you can stick with, and something you enjoy!   As you progress in your exercise regimen think about gradually increasing the following, week by week:   intensity (how strenuous is your workout)  frequency (how often you are exercising)  duration (how many minutes at a time you are exercising)  Walking for 20 minutes a day is certainly better than nothing, but more strenuous exercise will develop better cardiovascular fitness.   interval training (high-intensity alternating with low-intensity, think walk/jog rather than just walk)  muscle strengthening exercises (weight lifting, calisthenics, yoga) - this also helps prevent osteoporosis!   Things to remember for diet changes for weight loss:   Please note - I am not a certified dietician. I can present you with ideas and  general diet goals, but a meal plan is largely up to you. I am happy to refer you to a dietician who can give you a detailed meal plan.  Apps/logs are crucial to track how you're eating! It's not realistic to be logging everything you eat forever, but when you're starting a healthy eating lifestyle it's very helpful, and checking in with logs now and then helps you stick to your program!   Calorie restriction with the goal weight loss of no more than one to one and a half pounds per week.   Increase lean protein such as chicken, fish, [place].   Decrease fatty foods such as dairy, butter.   Decrease sugary foods. Avoid sugary drinks such as soda or juice.  Increase fiber found in fruit and vegetables.          Visit summary with medication list and pertinent instructions was printed for patient to review. All questions at time of visit were answered - patient instructed to contact office with any additional concerns or updates. ER/RTC precautions were reviewed with the patient.   Note: Total time spent 45 minutes, greater than 50% of the visit was spent face-to-face counseling and coordinating care for the above diagnoses listed in assessment/plan.   Please note: voice recognition software was used to produce this document, and typos may escape review. Please contact Dr. Sheppard Coil for any needed clarifications.     Follow-up plan: Return for nurse visit verify home BP monitor .2nd shingles shot in 2-6 mos

## 2018-09-04 DIAGNOSIS — M25561 Pain in right knee: Secondary | ICD-10-CM | POA: Diagnosis not present

## 2018-09-04 DIAGNOSIS — M25562 Pain in left knee: Secondary | ICD-10-CM | POA: Diagnosis not present

## 2018-09-13 ENCOUNTER — Other Ambulatory Visit: Payer: Self-pay

## 2018-09-13 ENCOUNTER — Encounter: Payer: Self-pay | Admitting: Osteopathic Medicine

## 2018-09-13 ENCOUNTER — Ambulatory Visit (INDEPENDENT_AMBULATORY_CARE_PROVIDER_SITE_OTHER): Payer: BC Managed Care – PPO | Admitting: Osteopathic Medicine

## 2018-09-13 VITALS — BP 147/85 | HR 70 | Temp 97.7°F | Wt 237.8 lb

## 2018-09-13 DIAGNOSIS — R635 Abnormal weight gain: Secondary | ICD-10-CM | POA: Diagnosis not present

## 2018-09-13 DIAGNOSIS — R030 Elevated blood-pressure reading, without diagnosis of hypertension: Secondary | ICD-10-CM | POA: Diagnosis not present

## 2018-09-13 DIAGNOSIS — R7301 Impaired fasting glucose: Secondary | ICD-10-CM | POA: Diagnosis not present

## 2018-09-13 MED ORDER — BUPROPION HCL ER (XL) 300 MG PO TB24: 300.0000 mg | ORAL_TABLET | Freq: Every day | ORAL | 3 refills | Status: DC

## 2018-09-13 MED ORDER — METRONIDAZOLE 0.75 % EX GEL: 1.0000 "application " | Freq: Two times a day (BID) | CUTANEOUS | 11 refills | Status: AC

## 2018-09-13 NOTE — Progress Notes (Signed)
Virtual Visit via Video (App used: Doximity) Note  I connected with      Caitlyn Thomas on 09/13/18 at 11:09 AM by a telemedicine application and verified that I am speaking with the correct person using two identifiers.  Patient is at home I am in office    I discussed the limitations of evaluation and management by telemedicine and the availability of in person appointments. The patient expressed understanding and agreed to proceed.  History of Present Illness: Caitlyn Thomas is a 63 y.o. female who would like to discuss recheck on medications    Will be losing insurance later this month and would like to get refills set up.   Last visit we stared Wellbutrin to hep with weight, ADD symptoms. She reports mood has improved, still struggling with attention issues   Depression screen [facility] 2/9 09/13/2018 08/16/2018  Decreased Interest 0 1  Down, Depressed, Hopeless 0 1  PHQ - 2 Score 0 2  Altered sleeping 3 1  Tired, decreased energy 2 0  Change in appetite 1 0  Feeling bad or failure about yourself  0 0  Trouble concentrating 3 3  Moving slowly or fidgety/restless 2 3  Suicidal thoughts 0 0  PHQ-9 Score 11 9  Difficult doing work/chores Not difficult at all Somewhat difficult   GAD 7 : Generalized Anxiety Score 09/13/2018 08/16/2018  Nervous, Anxious, on Edge 0 0  Control/stop worrying 0 0  Worry too much - different things 0 1  Trouble relaxing 0 0  Restless 0 1  Easily annoyed or irritable 0 1  Afraid - awful might happen 0 1  Total GAD 7 Score 0 4  Anxiety Difficulty - Not difficult at all    Immunization History  Administered Date(s) Administered  . Hepatitis A, Adult 04/27/2017  . Influenza,inj,Quad PF,6+ Mos 04/27/2017  . MMR 04/27/2017  . Tdap 04/27/2017  . Typhoid Live 05/18/2017  . Zoster Recombinat (Shingrix) 08/16/2018          Observations/Objective: BP (!) 147/85 (Patient Position: Sitting, Cuff Size: Normal)   Pulse 70   Temp 97.7 F  (36.5 C) (Oral)   Wt 237 lb 12.8 oz (107.9 kg)   BMI 34.12 kg/m  BP Readings from Last 3 Encounters:  09/13/18 (!) 147/85  08/16/18 140/85  11/25/17 (!) 154/95   Exam: Normal Speech.  NAD  Lab and Radiology Results No results found for this or any previous visit (from the past 72 hour(s)). No results found.     Assessment and Plan: 63 y.o. female with The primary encounter diagnosis was Weight gain. Diagnoses of Elevated blood pressure reading and Elevated fasting glucose were also pertinent to this visit.  Will get labs and refilled meds  Pt will be losing insurance but I'm happy to provide refills if needed for up to a year from now  New issues will need some kind of visit, pt aware that this clinic does see patients without insurance  PDMP not reviewed this encounter. Orders Placed This Encounter  Procedures  . COMPLETE METABOLIC PANEL WITH GFR  . TSH  . CBC  . Hemoglobin A1c  . Lipid panel   Meds ordered this encounter  Medications  . buPROPion (WELLBUTRIN XL) 300 MG 24 hr tablet    Sig: Take 1 tablet (300 mg total) by mouth daily.    Dispense:  90 tablet    Refill:  3  . metroNIDAZOLE (METROGEL) 0.75 % gel    Sig: Apply 1  application topically 2 (two) times daily.    Dispense:  45 g    Refill:  11      Follow Up Instructions: Return for ANNUALwhen due  (call week prior to visit for lab orders).    I discussed the assessment and treatment plan with the patient. The patient was provided an opportunity to ask questions and all were answered. The patient agreed with the plan and demonstrated an understanding of the instructions.   The patient was advised to call back or seek an in-person evaluation if any new concerns, if symptoms worsen or if the condition fails to improve as anticipated.  25 minutes of non-face-to-face time was provided during this encounter.                      Historical information moved to improve visibility of  documentation.  Past Medical History:  Diagnosis Date  . Arthritis    bone on bone (Knees and Hips)  . UTI (lower urinary tract infection)    Past Surgical History:  Procedure Laterality Date  . ABDOMINAL HYSTERECTOMY  1986  . NASAL SINUS SURGERY  1992  . REPLACEMENT TOTAL KNEE Left 01/2017  . REPLACEMENT TOTAL KNEE Left    Social History   Tobacco Use  . Smoking status: Former Smoker    Types: Cigarettes    Quit date: 1985    Years since quitting: 35.5  . Smokeless tobacco: Never Used  Substance Use Topics  . Alcohol use: Yes   family history includes Diabetes in her maternal grandmother; Heart attack in her paternal grandmother and sister.  Medications: Current Outpatient Medications  Medication Sig Dispense Refill  . buPROPion (WELLBUTRIN XL) 300 MG 24 hr tablet Take 1 tablet (300 mg total) by mouth daily. 90 tablet 3  . metroNIDAZOLE (METROGEL) 0.75 % gel Apply 1 application topically 2 (two) times daily. 45 g 11   No current facility-administered medications for this visit.    No Known Allergies  PDMP not reviewed this encounter. Orders Placed This Encounter  Procedures  . COMPLETE METABOLIC PANEL WITH GFR  . TSH  . CBC  . Hemoglobin A1c  . Lipid panel   Meds ordered this encounter  Medications  . buPROPion (WELLBUTRIN XL) 300 MG 24 hr tablet    Sig: Take 1 tablet (300 mg total) by mouth daily.    Dispense:  90 tablet    Refill:  3  . metroNIDAZOLE (METROGEL) 0.75 % gel    Sig: Apply 1 application topically 2 (two) times daily.    Dispense:  45 g    Refill:  11

## 2018-09-16 LAB — COMPLETE METABOLIC PANEL WITH GFR
AG Ratio: 1.8 (calc) (ref 1.0–2.5)
ALT: 32 U/L — ABNORMAL HIGH (ref 6–29)
AST: 21 U/L (ref 10–35)
Albumin: 4.1 g/dL (ref 3.6–5.1)
Alkaline phosphatase (APISO): 74 U/L (ref 37–153)
BUN: 10 mg/dL (ref 7–25)
CO2: 29 mmol/L (ref 20–32)
Calcium: 10.7 mg/dL — ABNORMAL HIGH (ref 8.6–10.4)
Chloride: 102 mmol/L (ref 98–110)
Creat: 0.79 mg/dL (ref 0.50–0.99)
GFR, Est African American: 93 mL/min/{1.73_m2} (ref >=60)
GFR, Est Non African American: 80 mL/min/{1.73_m2} (ref >=60)
Globulin: 2.3 g/dL (calc) (ref 1.9–3.7)
Glucose, Bld: 111 mg/dL (ref 65–139)
Potassium: 4.3 mmol/L (ref 3.5–5.3)
Sodium: 138 mmol/L (ref 135–146)
Total Bilirubin: 0.4 mg/dL (ref 0.2–1.2)
Total Protein: 6.4 g/dL (ref 6.1–8.1)

## 2018-09-16 LAB — HEMOGLOBIN A1C
Hgb A1c MFr Bld: 5.6 % of total Hgb (ref <5.7)
Mean Plasma Glucose: 114 (calc)
eAG (mmol/L): 6.3 (calc)

## 2018-09-16 LAB — CBC
HCT: 40.6 % (ref 35.0–45.0)
Hemoglobin: 14.1 g/dL (ref 11.7–15.5)
MCH: 32.5 pg (ref 27.0–33.0)
MCHC: 34.7 g/dL (ref 32.0–36.0)
MCV: 93.5 fL (ref 80.0–100.0)
MPV: 10 fL (ref 7.5–12.5)
Platelets: 302 10*3/uL (ref 140–400)
RBC: 4.34 10*6/uL (ref 3.80–5.10)
RDW: 12 % (ref 11.0–15.0)
WBC: 6.3 10*3/uL (ref 3.8–10.8)

## 2018-09-16 LAB — TSH: TSH: 1.74 mIU/L (ref 0.40–4.50)

## 2018-09-22 ENCOUNTER — Ambulatory Visit (HOSPITAL_BASED_OUTPATIENT_CLINIC_OR_DEPARTMENT_OTHER)
Admission: RE | Admit: 2018-09-22 | Discharge: 2018-09-22 | Disposition: A | Discharge status: Home / Self Care | Payer: BC Managed Care – PPO | Source: Ambulatory Visit | Attending: Osteopathic Medicine | Admitting: Osteopathic Medicine

## 2018-09-22 ENCOUNTER — Other Ambulatory Visit: Payer: Self-pay

## 2018-09-22 DIAGNOSIS — Z1231 Encounter for screening mammogram for malignant neoplasm of breast: Secondary | ICD-10-CM | POA: Insufficient documentation

## 2018-09-22 DIAGNOSIS — Z1239 Encounter for other screening for malignant neoplasm of breast: Secondary | ICD-10-CM | POA: Diagnosis not present

## 2018-09-22 NOTE — Addendum Note (Signed)
Addended by: Maryla Morrow on: 09/22/2018 08:38 AM   Modules accepted: Orders

## 2018-09-26 ENCOUNTER — Other Ambulatory Visit: Payer: Self-pay | Admitting: Osteopathic Medicine

## 2018-09-26 DIAGNOSIS — R928 Other abnormal and inconclusive findings on diagnostic imaging of breast: Secondary | ICD-10-CM

## 2018-09-27 ENCOUNTER — Encounter: Payer: Self-pay | Admitting: Osteopathic Medicine

## 2018-09-28 ENCOUNTER — Ambulatory Visit
Admission: RE | Admit: 2018-09-28 | Discharge: 2018-09-28 | Disposition: A | Discharge status: Home / Self Care | Payer: BC Managed Care – PPO | Source: Ambulatory Visit | Attending: Osteopathic Medicine | Admitting: Osteopathic Medicine

## 2018-09-28 ENCOUNTER — Other Ambulatory Visit: Payer: Self-pay

## 2018-09-28 ENCOUNTER — Other Ambulatory Visit: Payer: Self-pay | Admitting: Osteopathic Medicine

## 2018-09-28 DIAGNOSIS — N6322 Unspecified lump in the left breast, upper inner quadrant: Secondary | ICD-10-CM | POA: Diagnosis not present

## 2018-09-28 DIAGNOSIS — N6320 Unspecified lump in the left breast, unspecified quadrant: Secondary | ICD-10-CM

## 2018-09-28 DIAGNOSIS — R928 Other abnormal and inconclusive findings on diagnostic imaging of breast: Secondary | ICD-10-CM

## 2018-09-28 DIAGNOSIS — R922 Inconclusive mammogram: Secondary | ICD-10-CM | POA: Diagnosis not present

## 2018-09-28 LAB — COMPLETE METABOLIC PANEL WITH GFR
AG Ratio: 1.9 (calc) (ref 1.0–2.5)
ALT: 37 U/L — ABNORMAL HIGH (ref 6–29)
AST: 22 U/L (ref 10–35)
Albumin: 4.3 g/dL (ref 3.6–5.1)
Alkaline phosphatase (APISO): 72 U/L (ref 37–153)
BUN: 8 mg/dL (ref 7–25)
CO2: 26 mmol/L (ref 20–32)
Calcium: 10.6 mg/dL — ABNORMAL HIGH (ref 8.6–10.4)
Chloride: 103 mmol/L (ref 98–110)
Creat: 0.71 mg/dL (ref 0.50–0.99)
GFR, Est African American: 106 mL/min/{1.73_m2} (ref >=60)
GFR, Est Non African American: 91 mL/min/{1.73_m2} (ref >=60)
Globulin: 2.3 g/dL (calc) (ref 1.9–3.7)
Glucose, Bld: 114 mg/dL — ABNORMAL HIGH (ref 65–99)
Potassium: 4.3 mmol/L (ref 3.5–5.3)
Sodium: 137 mmol/L (ref 135–146)
Total Bilirubin: 0.5 mg/dL (ref 0.2–1.2)
Total Protein: 6.6 g/dL (ref 6.1–8.1)

## 2018-09-28 LAB — LIPID PANEL W/REFLEX DIRECT LDL
Cholesterol: 249 mg/dL — ABNORMAL HIGH (ref <200)
HDL: 52 mg/dL (ref >=50)
LDL Cholesterol (Calc): 159 mg/dL (calc) — ABNORMAL HIGH
Non-HDL Cholesterol (Calc): 197 mg/dL (calc) — ABNORMAL HIGH (ref <130)
Total CHOL/HDL Ratio: 4.8 (calc) (ref <5.0)
Triglycerides: 212 mg/dL — ABNORMAL HIGH (ref <150)

## 2018-09-28 LAB — PTH, INTACT AND CALCIUM
Calcium: 10.6 mg/dL — ABNORMAL HIGH (ref 8.6–10.4)
PTH: 82 pg/mL — ABNORMAL HIGH (ref 14–64)

## 2018-09-28 LAB — VITAMIN D 25 HYDROXY (VIT D DEFICIENCY, FRACTURES): Vit D, 25-Hydroxy: 16 ng/mL — ABNORMAL LOW (ref 30–100)

## 2018-10-05 ENCOUNTER — Telehealth (INDEPENDENT_AMBULATORY_CARE_PROVIDER_SITE_OTHER): Payer: BC Managed Care – PPO | Admitting: Osteopathic Medicine

## 2018-10-05 ENCOUNTER — Encounter: Payer: Self-pay | Admitting: Osteopathic Medicine

## 2018-10-05 DIAGNOSIS — R41840 Attention and concentration deficit: Secondary | ICD-10-CM | POA: Diagnosis not present

## 2018-10-05 DIAGNOSIS — E210 Primary hyperparathyroidism: Secondary | ICD-10-CM | POA: Diagnosis not present

## 2018-10-05 MED ORDER — BUPROPION HCL ER (XL) 150 MG PO TB24: 150.0000 mg | ORAL_TABLET | ORAL | 0 refills | Status: DC

## 2018-10-05 MED ORDER — BUPROPION HCL ER (XL) 150 MG PO TB24: 150.0000 mg | ORAL_TABLET | ORAL | 3 refills | Status: DC

## 2018-10-05 NOTE — Progress Notes (Signed)
Virtual Visit via Video (App used: Doximity) Note  I connected with      Caitlyn Thomas on 10/05/18 at 8:10 AM by a telemedicine application and verified that I am speaking with the correct person using two identifiers.  Patient is at home I am in office    I discussed the limitations of evaluation and management by telemedicine and the availability of in person appointments. The patient expressed understanding and agreed to proceed.  History of Present Illness: Caitlyn Thomas is a 63 y.o. female who would like to discuss lab results    Recent slightly elevated calcium prompted testing for PTH levels and recheck calcium was also still elevated.  Concern for hyperparathyroidism.  This comes at a difficult time as patient is out of work and will be losing insurance.   Would also like to go back to Wellbutrin 150 mg, no previous official diagnosis of ADHD and patient has never been on stimulants.       Observations/Objective: There were no vitals taken for this visit. BP Readings from Last 3 Encounters:  09/13/18 (!) 147/85  08/16/18 140/85  11/25/17 (!) 154/95   Exam: Normal Speech.  NAD  Lab and Radiology Results No results found for this or any previous visit (from the past 72 hour(s)). No results found.     Assessment and Plan: 63 y.o. female with The primary encounter diagnosis was Primary hyperparathyroidism ([place]). A diagnosis of Concentration deficit was also pertinent to this visit.  We will go ahead and send referral to endocrinology, hopefully we can get her set up somewhere within the next couple of weeks to come up with a better plan of aggressive work-up now versus wait and recheck later and what timeframe that would look like.  We will go back down to 150 mg Wellbutrin, at some point would like to get official ADHD testing.  PDMP not reviewed this encounter. Orders Placed This Encounter  Procedures  . Ambulatory referral to Endocrinology     Referral Priority:   Routine    Referral Type:   Consultation    Referral Reason:   Specialty Services Required    Number of Visits Requested:   1   Meds ordered this encounter  Medications  . DISCONTD: buPROPion (WELLBUTRIN XL) 150 MG 24 hr tablet    Sig: Take 1 tablet (150 mg total) by mouth every morning.    Dispense:  90 tablet    Refill:  0  . buPROPion (WELLBUTRIN XL) 150 MG 24 hr tablet    Sig: Take 1 tablet (150 mg total) by mouth every morning.    Dispense:  90 tablet    Refill:  3   There are no Patient Instructions on file for this visit.  Instructions sent via MyChart. If MyChart not available, pt was given option for info via personal e-mail w/ no guarantee of protected health info over unsecured e-mail communication, and MyChart sign-up instructions were included.   Follow Up Instructions: No follow-ups on file.    I discussed the assessment and treatment plan with the patient. The patient was provided an opportunity to ask questions and all were answered. The patient agreed with the plan and demonstrated an understanding of the instructions.   The patient was advised to call back or seek an in-person evaluation if any new concerns, if symptoms worsen or if the condition fails to improve as anticipated.  25 minutes of non-face-to-face time was provided during this encounter.  Historical information moved to improve visibility of documentation.  Past Medical History:  Diagnosis Date  . Arthritis    bone on bone (Knees and Hips)  . UTI (lower urinary tract infection)    Past Surgical History:  Procedure Laterality Date  . ABDOMINAL HYSTERECTOMY  1986  . NASAL SINUS SURGERY  1992  . REPLACEMENT TOTAL KNEE Left 01/2017  . REPLACEMENT TOTAL KNEE Left    Social History   Tobacco Use  . Smoking status: Former Smoker    Types: Cigarettes    Quit date: 1985    Years since quitting: 35.6  . Smokeless tobacco: Never Used   Substance Use Topics  . Alcohol use: Yes   family history includes Diabetes in her maternal grandmother; Heart attack in her paternal grandmother and sister.  Medications: Current Outpatient Medications  Medication Sig Dispense Refill  . metroNIDAZOLE (METROGEL) 0.75 % gel Apply 1 application topically 2 (two) times daily. 45 g 11  . buPROPion (WELLBUTRIN XL) 150 MG 24 hr tablet Take 1 tablet (150 mg total) by mouth every morning. 90 tablet 3   No current facility-administered medications for this visit.    No Known Allergies  PDMP not reviewed this encounter. Orders Placed This Encounter  Procedures  . Ambulatory referral to Endocrinology    Referral Priority:   Routine    Referral Type:   Consultation    Referral Reason:   Specialty Services Required    Number of Visits Requested:   1   Meds ordered this encounter  Medications  . DISCONTD: buPROPion (WELLBUTRIN XL) 150 MG 24 hr tablet    Sig: Take 1 tablet (150 mg total) by mouth every morning.    Dispense:  90 tablet    Refill:  0  . buPROPion (WELLBUTRIN XL) 150 MG 24 hr tablet    Sig: Take 1 tablet (150 mg total) by mouth every morning.    Dispense:  90 tablet    Refill:  3

## 2018-10-10 ENCOUNTER — Telehealth: Payer: Self-pay

## 2018-10-10 NOTE — Telephone Encounter (Signed)
Pt left a vm msg requesting an update on Endocrinologist referral. As per pt, her insurance is due to expire soon. Requesting to expedite referral. Thanks.

## 2018-10-12 ENCOUNTER — Telehealth: Payer: Self-pay

## 2018-10-12 NOTE — Telephone Encounter (Signed)
Referral was sent on 10/10/2018 to [facility] she can call the office at [phone] to schedule. - CF

## 2018-10-12 NOTE — Telephone Encounter (Signed)
Pt called - she stated that she was not pleased with the answers given by [facility]. Pt has decided she does not want to proceed to indicated facility for initial visit. Pt is requesting referral to be changed to [facility] Endocrinology - Dr. Alanson Aly. She has an appt scheduled for 10/20/18. Office is requesting recent visit notes & lab results. Paperwork can be faxed to [phone]. Thanks.

## 2018-10-12 NOTE — Telephone Encounter (Signed)
Thank you for the update!

## 2018-10-16 NOTE — Telephone Encounter (Signed)
Faxed information to Doctor patient requested - CF

## 2018-10-17 ENCOUNTER — Ambulatory Visit: Payer: BC Managed Care – PPO

## 2018-10-20 ENCOUNTER — Telehealth: Payer: Self-pay | Admitting: Osteopathic Medicine

## 2018-10-20 DIAGNOSIS — E210 Primary hyperparathyroidism: Secondary | ICD-10-CM

## 2018-10-20 NOTE — Telephone Encounter (Signed)
I have prionted off the labs and the referral and faxed to Vivian's attention.

## 2018-10-20 NOTE — Telephone Encounter (Signed)
Patient called and left a VM that she was not happy with the referral at [facility] as well. I have placed the referral. Ok to sign off? Please advise.

## 2018-10-20 NOTE — Telephone Encounter (Signed)
Adonis Huguenin from [facility] called this morning and wants to know if you can fax over lab results with referral for Caitlyn Thomas before her appt at 1:30 pm this afternoon.

## 2018-10-23 NOTE — Telephone Encounter (Signed)
Signed.

## 2018-10-28 ENCOUNTER — Telehealth: Payer: Self-pay | Admitting: Osteopathic Medicine

## 2018-10-28 DIAGNOSIS — E213 Hyperparathyroidism, unspecified: Secondary | ICD-10-CM

## 2018-10-28 NOTE — Telephone Encounter (Signed)
Patient's husband came to establish care with me earlier this week.  He brought up several concerns with regard to Ms. Ditommaso's vitamin D/parathyroid/calcium issues and would like her to be referred to a different endocrinologist.  He gave me the name for a Dr. Lavetta Nielsen, DO at [facility]. Phone [phone], fax [phone] can we place referral? Thanks.

## 2018-10-30 ENCOUNTER — Ambulatory Visit: Payer: BC Managed Care – PPO

## 2018-10-30 DIAGNOSIS — E213 Hyperparathyroidism, unspecified: Secondary | ICD-10-CM | POA: Diagnosis not present

## 2018-10-30 DIAGNOSIS — E210 Primary hyperparathyroidism: Secondary | ICD-10-CM | POA: Diagnosis not present

## 2018-10-30 DIAGNOSIS — E559 Vitamin D deficiency, unspecified: Secondary | ICD-10-CM | POA: Diagnosis not present

## 2018-10-30 NOTE — Telephone Encounter (Signed)
I sent referral to [facility] at patient requested. - CF

## 2018-10-31 DIAGNOSIS — E210 Primary hyperparathyroidism: Secondary | ICD-10-CM | POA: Insufficient documentation

## 2018-10-31 DIAGNOSIS — E559 Vitamin D deficiency, unspecified: Secondary | ICD-10-CM | POA: Insufficient documentation

## 2018-11-08 DIAGNOSIS — E210 Primary hyperparathyroidism: Secondary | ICD-10-CM | POA: Diagnosis not present

## 2018-11-14 DIAGNOSIS — E210 Primary hyperparathyroidism: Secondary | ICD-10-CM | POA: Diagnosis not present

## 2018-11-14 DIAGNOSIS — E042 Nontoxic multinodular goiter: Secondary | ICD-10-CM | POA: Diagnosis not present

## 2018-11-14 DIAGNOSIS — E213 Hyperparathyroidism, unspecified: Secondary | ICD-10-CM | POA: Diagnosis not present

## 2018-11-14 DIAGNOSIS — M4802 Spinal stenosis, cervical region: Secondary | ICD-10-CM | POA: Diagnosis not present

## 2018-11-17 ENCOUNTER — Ambulatory Visit (INDEPENDENT_AMBULATORY_CARE_PROVIDER_SITE_OTHER): Payer: BC Managed Care – PPO | Admitting: Sports Medicine

## 2018-11-17 ENCOUNTER — Other Ambulatory Visit: Payer: Self-pay

## 2018-11-17 VITALS — BP 144/89 | HR 75 | Temp 98.0°F

## 2018-11-17 DIAGNOSIS — Z23 Encounter for immunization: Secondary | ICD-10-CM | POA: Diagnosis not present

## 2018-11-17 NOTE — Progress Notes (Signed)
Patient is here for a shingles and flu vaccination. Denies any new problems or concerns. Shingles and flu injection to right deltoid with no apparent complications. She requested injections in the same arm. Patient had questions about pneumonia vaccine. She was advised to discuss with PCP since she is not 63 years old and no diagnosed risk factors. She does state when she gets sick it has always gone to her chest.

## 2018-11-28 DIAGNOSIS — E210 Primary hyperparathyroidism: Secondary | ICD-10-CM | POA: Diagnosis not present

## 2018-12-05 DIAGNOSIS — E041 Nontoxic single thyroid nodule: Secondary | ICD-10-CM | POA: Diagnosis not present

## 2018-12-05 DIAGNOSIS — E210 Primary hyperparathyroidism: Secondary | ICD-10-CM | POA: Diagnosis not present

## 2018-12-05 DIAGNOSIS — D34 Benign neoplasm of thyroid gland: Secondary | ICD-10-CM | POA: Diagnosis not present

## 2018-12-05 DIAGNOSIS — E559 Vitamin D deficiency, unspecified: Secondary | ICD-10-CM | POA: Diagnosis not present

## 2018-12-08 DIAGNOSIS — E042 Nontoxic multinodular goiter: Secondary | ICD-10-CM | POA: Diagnosis not present

## 2018-12-08 DIAGNOSIS — E210 Primary hyperparathyroidism: Secondary | ICD-10-CM | POA: Diagnosis not present

## 2018-12-30 ENCOUNTER — Encounter: Payer: Self-pay | Admitting: Osteopathic Medicine

## 2019-01-08 DIAGNOSIS — Z20828 Contact with and (suspected) exposure to other viral communicable diseases: Secondary | ICD-10-CM | POA: Diagnosis not present

## 2019-01-08 DIAGNOSIS — E213 Hyperparathyroidism, unspecified: Secondary | ICD-10-CM | POA: Diagnosis not present

## 2019-01-08 DIAGNOSIS — Z01812 Encounter for preprocedural laboratory examination: Secondary | ICD-10-CM | POA: Diagnosis not present

## 2019-01-10 DIAGNOSIS — E042 Nontoxic multinodular goiter: Secondary | ICD-10-CM | POA: Diagnosis not present

## 2019-01-10 DIAGNOSIS — E210 Primary hyperparathyroidism: Secondary | ICD-10-CM | POA: Diagnosis not present

## 2019-01-10 DIAGNOSIS — C73 Malignant neoplasm of thyroid gland: Secondary | ICD-10-CM | POA: Diagnosis not present

## 2019-01-10 DIAGNOSIS — E559 Vitamin D deficiency, unspecified: Secondary | ICD-10-CM | POA: Diagnosis not present

## 2019-01-10 DIAGNOSIS — Z6833 Body mass index (BMI) 33.0-33.9, adult: Secondary | ICD-10-CM | POA: Diagnosis not present

## 2019-01-10 DIAGNOSIS — K219 Gastro-esophageal reflux disease without esophagitis: Secondary | ICD-10-CM | POA: Diagnosis not present

## 2019-01-10 DIAGNOSIS — E049 Nontoxic goiter, unspecified: Secondary | ICD-10-CM | POA: Diagnosis not present

## 2019-01-10 DIAGNOSIS — D351 Benign neoplasm of parathyroid gland: Secondary | ICD-10-CM | POA: Diagnosis not present

## 2019-01-10 DIAGNOSIS — Z87891 Personal history of nicotine dependence: Secondary | ICD-10-CM | POA: Diagnosis not present

## 2019-01-10 DIAGNOSIS — Z90710 Acquired absence of both cervix and uterus: Secondary | ICD-10-CM | POA: Diagnosis not present

## 2019-01-10 DIAGNOSIS — Z79899 Other long term (current) drug therapy: Secondary | ICD-10-CM | POA: Diagnosis not present

## 2019-01-10 DIAGNOSIS — E669 Obesity, unspecified: Secondary | ICD-10-CM | POA: Diagnosis not present

## 2019-01-11 DIAGNOSIS — E049 Nontoxic goiter, unspecified: Secondary | ICD-10-CM | POA: Diagnosis not present

## 2019-01-11 DIAGNOSIS — E210 Primary hyperparathyroidism: Secondary | ICD-10-CM | POA: Diagnosis not present

## 2019-01-11 DIAGNOSIS — Z6833 Body mass index (BMI) 33.0-33.9, adult: Secondary | ICD-10-CM | POA: Diagnosis not present

## 2019-01-11 DIAGNOSIS — E559 Vitamin D deficiency, unspecified: Secondary | ICD-10-CM | POA: Diagnosis not present

## 2019-01-11 DIAGNOSIS — Z90710 Acquired absence of both cervix and uterus: Secondary | ICD-10-CM | POA: Diagnosis not present

## 2019-01-11 DIAGNOSIS — E042 Nontoxic multinodular goiter: Secondary | ICD-10-CM | POA: Diagnosis not present

## 2019-01-11 DIAGNOSIS — Z79899 Other long term (current) drug therapy: Secondary | ICD-10-CM | POA: Diagnosis not present

## 2019-01-11 DIAGNOSIS — K219 Gastro-esophageal reflux disease without esophagitis: Secondary | ICD-10-CM | POA: Diagnosis not present

## 2019-01-11 DIAGNOSIS — E669 Obesity, unspecified: Secondary | ICD-10-CM | POA: Diagnosis not present

## 2019-01-11 DIAGNOSIS — D351 Benign neoplasm of parathyroid gland: Secondary | ICD-10-CM | POA: Diagnosis not present

## 2019-01-11 DIAGNOSIS — Z87891 Personal history of nicotine dependence: Secondary | ICD-10-CM | POA: Diagnosis not present

## 2019-01-19 DIAGNOSIS — C73 Malignant neoplasm of thyroid gland: Secondary | ICD-10-CM | POA: Diagnosis not present

## 2019-01-19 DIAGNOSIS — Z4889 Encounter for other specified surgical aftercare: Secondary | ICD-10-CM | POA: Diagnosis not present

## 2019-01-19 DIAGNOSIS — E890 Postprocedural hypothyroidism: Secondary | ICD-10-CM | POA: Diagnosis not present

## 2019-01-19 DIAGNOSIS — E210 Primary hyperparathyroidism: Secondary | ICD-10-CM | POA: Diagnosis not present

## 2019-01-19 DIAGNOSIS — Z9889 Other specified postprocedural states: Secondary | ICD-10-CM | POA: Diagnosis not present

## 2019-01-23 ENCOUNTER — Encounter: Payer: Self-pay | Admitting: Osteopathic Medicine

## 2019-01-23 ENCOUNTER — Encounter: Payer: Self-pay | Admitting: Internal Medicine

## 2019-01-23 ENCOUNTER — Ambulatory Visit (INDEPENDENT_AMBULATORY_CARE_PROVIDER_SITE_OTHER): Payer: BC Managed Care – PPO | Admitting: Osteopathic Medicine

## 2019-01-23 VITALS — BP 144/87 | HR 80 | Temp 97.8°F | Wt 232.0 lb

## 2019-01-23 DIAGNOSIS — Z1211 Encounter for screening for malignant neoplasm of colon: Secondary | ICD-10-CM | POA: Diagnosis not present

## 2019-01-23 DIAGNOSIS — Z8619 Personal history of other infectious and parasitic diseases: Secondary | ICD-10-CM

## 2019-01-23 DIAGNOSIS — E892 Postprocedural hypoparathyroidism: Secondary | ICD-10-CM

## 2019-01-23 DIAGNOSIS — Z90710 Acquired absence of both cervix and uterus: Secondary | ICD-10-CM

## 2019-01-23 DIAGNOSIS — R928 Other abnormal and inconclusive findings on diagnostic imaging of breast: Secondary | ICD-10-CM

## 2019-01-23 DIAGNOSIS — Z9189 Other specified personal risk factors, not elsewhere classified: Secondary | ICD-10-CM | POA: Diagnosis not present

## 2019-01-23 DIAGNOSIS — Z9089 Acquired absence of other organs: Secondary | ICD-10-CM

## 2019-01-23 DIAGNOSIS — Z9889 Other specified postprocedural states: Secondary | ICD-10-CM

## 2019-01-23 DIAGNOSIS — C73 Malignant neoplasm of thyroid gland: Secondary | ICD-10-CM | POA: Diagnosis not present

## 2019-01-23 HISTORY — DX: Acquired absence of both cervix and uterus: Z90.710

## 2019-01-23 HISTORY — DX: Postprocedural hypoparathyroidism: E89.2

## 2019-01-23 HISTORY — DX: Malignant neoplasm of thyroid gland: C73

## 2019-01-23 HISTORY — DX: Acquired absence of other organs: Z90.89

## 2019-01-23 HISTORY — DX: Other abnormal and inconclusive findings on diagnostic imaging of breast: R92.8

## 2019-01-23 NOTE — Patient Instructions (Addendum)
I definitely STRONGLY RECOMMEND the following preventive care measures:   Follow up as recommended for further breast imaging (diagnostic mammogram and ultrasound)   Colon cancer screening with colonoscopy (I've laced rreferral)  Discuss w/ OBGYN regarding further need for Pap since you had hysterectomy for HPV-related reasons. They might also be able to better answer questions regarding risk of other testing if needed for anogenital or throat cancers.   Follow up w/ [facility] as directed I've placed order for sleep study   Will plan to get annual physical in 6 months or so, but can see me sooner if needed!

## 2019-01-23 NOTE — Progress Notes (Signed)
Virtual Visit via Video (App used: Doximity) Note  I connected with      Caitlyn Thomas on 01/23/19 at 11:19 AM by a telemedicine application and verified that I am speaking with the correct person using two identifiers.  Patient is at home I am in office   I discussed the limitations of evaluation and management by telemedicine and the availability of in person appointments. The patient expressed understanding and agreed to proceed.  History of Present Illness: Caitlyn Thomas is a 63 y.o. female who would like to discuss screening tests   Has met OOP max for her health insurance after recent treatment for hyperparathyroid and thyroid cancer (following w/ [facility]), and just wants to check up on a couple other things.   Hx HPV and this was why she had a hysterectomy (per patient). Used to be a smoker but quit w/ previous pregnancy >20 years ago. Has questions about throat cancer screening.   Declines breast cancer screening f/u after mammo recommended additional imaging. See A/P.  Reluctant to get colonoscopy.   Requesting sleep study, there were come concerns from anesthesiologist in her most recent procedures that she might have sleep apnea.     Observations/Objective: BP (!) 144/87 (Patient Position: Sitting, Cuff Size: Normal)   Pulse 80   Temp 97.8 F (36.6 C) (Oral)   Wt 232 lb (105.2 kg)   BMI 33.29 kg/m  BP Readings from Last 3 Encounters:  01/23/19 (!) 144/87  11/17/18 (!) 144/89  09/13/18 (!) 147/85   Exam: Normal Speech.  NAD  Lab and Radiology Results No results found for this or any previous visit (from the past 72 hour(s)). No results found.     Assessment and Plan: 63 y.o. female with The primary encounter diagnosis was Colon cancer screening. Diagnoses of History of HPV infection, Risk factors for obstructive sleep apnea, Papillary thyroid carcinoma ([place]), S/P parathyroidectomy, Abnormal mammogram, and H/O: hysterectomy were also pertinent to  this visit.   PDMP not reviewed this encounter. Orders Placed This Encounter  Procedures  . REFER OBGYN    Referral Priority:   Routine    Referral Type:   Consultation    Referral Reason:   Specialty Services Required    Requested Specialty:   Obstetrics and Gynecology    Number of Visits Requested:   1  . Ambulatory referral to Gastroenterology    Referral Priority:   Routine    Referral Type:   Consultation    Referral Reason:   Specialty Services Required    Number of Visits Requested:   1  . Home sleep test    Order Specific Question:   Where should this test be performed:    Answer:   Other   No orders of the defined types were placed in this encounter.  Patient Instructions  I definitely STRONGLY RECOMMEND the following preventive care measures:   Follow up as recommended for further breast imaging (diagnostic mammogram and ultrasound)   Colon cancer screening with colonoscopy (I've laced rreferral)  Discuss w/ OBGYN regarding further need for Pap since you had hysterectomy for HPV-related reasons. They might also be able to better answer questions regarding risk of other testing if needed for anogenital or throat cancers.   Follow up w/ [facility] as directed I've placed order for sleep study   Will plan to get annual physical in 6 months or so, but can see me sooner if needed!      Instructions sent via MyChart.  If MyChart not available, pt was given option for info via personal e-mail w/ no guarantee of protected health info over unsecured e-mail communication, and MyChart sign-up instructions were sent to patient.   Follow Up Instructions: Return in about 6 months (around 07/23/2019) for ANNUAL (call week prior to visit for lab orders).    I discussed the assessment and treatment plan with the patient. The patient was provided an opportunity to ask questions and all were answered. The patient agreed with the plan and demonstrated an understanding of the  instructions.   The patient was advised to call back or seek an in-person evaluation if any new concerns, if symptoms worsen or if the condition fails to improve as anticipated.  25 minutes of non-face-to-face time was provided during this encounter.      . . . . . . . . . . . . . Marland Kitchen                   Historical information moved to improve visibility of documentation.  Past Medical History:  Diagnosis Date  . Abnormal mammogram 01/23/2019   declined further w/u   . Arthritis    bone on bone (Knees and Hips)  . H/O: hysterectomy 01/23/2019   "HPV" per patient, thinks CIN2?  Marland Kitchen Papillary thyroid carcinoma ([place]) 01/23/2019   S/p [facility]roidectomy at [facility]  . S/P parathyroidectomy 01/23/2019  . UTI (lower urinary tract infection)    Past Surgical History:  Procedure Laterality Date  . ABDOMINAL HYSTERECTOMY  1986  . NASAL SINUS SURGERY  1992  . REPLACEMENT TOTAL KNEE Left 01/2017  . REPLACEMENT TOTAL KNEE Left    Social History   Tobacco Use  . Smoking status: Former Smoker    Types: Cigarettes    Quit date: 1985    Years since quitting: 35.9  . Smokeless tobacco: Never Used  Substance Use Topics  . Alcohol use: Yes   family history includes Diabetes in her maternal grandmother; Heart attack in her paternal grandmother and sister.  Medications: Current Outpatient Medications  Medication Sig Dispense Refill  . buPROPion (WELLBUTRIN XL) 150 MG 24 hr tablet Take 1 tablet (150 mg total) by mouth every morning. 90 tablet 3  . levothyroxine (SYNTHROID) 150 MCG tablet Take by mouth.    . metroNIDAZOLE (METROGEL) 0.75 % gel Apply 1 application topically 2 (two) times daily. 45 g 11  . Vitamin D, Ergocalciferol, (DRISDOL) 1.25 MG (50000 UT) CAPS capsule 1 capsule. Every 10 days     No current facility-administered medications for this visit.    No Known Allergies

## 2019-01-30 ENCOUNTER — Encounter: Payer: Self-pay | Admitting: Osteopathic Medicine

## 2019-02-01 NOTE — Telephone Encounter (Signed)
I did not have the order for the sleep test I printed it out and faxed it to [facility] today. Hopefully they will get it approved and get her scheduled quick. - CF

## 2019-02-05 ENCOUNTER — Other Ambulatory Visit: Payer: Self-pay

## 2019-02-05 ENCOUNTER — Encounter: Payer: Self-pay | Admitting: Internal Medicine

## 2019-02-05 ENCOUNTER — Ambulatory Visit (AMBULATORY_SURGERY_CENTER): Payer: Self-pay | Admitting: *Deleted

## 2019-02-05 VITALS — Ht 69.0 in | Wt 232.8 lb

## 2019-02-05 DIAGNOSIS — Z1159 Encounter for screening for other viral diseases: Secondary | ICD-10-CM

## 2019-02-05 DIAGNOSIS — Z1211 Encounter for screening for malignant neoplasm of colon: Secondary | ICD-10-CM

## 2019-02-05 MED ORDER — SUPREP BOWEL PREP KIT 17.5-3.13-1.6 GM/177ML PO SOLN: 1.0000 | Freq: Once | ORAL | 0 refills | Status: AC

## 2019-02-05 NOTE — Progress Notes (Signed)
Patient denies any allergies to egg or soy products. Patient denies complications with anesthesia/sedation.  Patient denies oxygen use at home and denies diet medications. Emmi instructions for colonoscopy/endoscopy explained and given to patient.  Suprep coupon given.   

## 2019-02-06 DIAGNOSIS — C73 Malignant neoplasm of thyroid gland: Secondary | ICD-10-CM | POA: Insufficient documentation

## 2019-02-06 DIAGNOSIS — E210 Primary hyperparathyroidism: Secondary | ICD-10-CM | POA: Diagnosis not present

## 2019-02-07 ENCOUNTER — Ambulatory Visit (INDEPENDENT_AMBULATORY_CARE_PROVIDER_SITE_OTHER): Payer: BC Managed Care – PPO

## 2019-02-07 DIAGNOSIS — Z1159 Encounter for screening for other viral diseases: Secondary | ICD-10-CM | POA: Diagnosis not present

## 2019-02-08 LAB — SARS CORONAVIRUS 2 (TAT 6-24 HRS): SARS Coronavirus 2: NEGATIVE

## 2019-02-12 ENCOUNTER — Encounter: Payer: Self-pay | Admitting: Internal Medicine

## 2019-02-12 ENCOUNTER — Other Ambulatory Visit: Payer: Self-pay

## 2019-02-12 ENCOUNTER — Other Ambulatory Visit: Payer: Self-pay | Admitting: Internal Medicine

## 2019-02-12 ENCOUNTER — Ambulatory Visit (AMBULATORY_SURGERY_CENTER): Payer: BC Managed Care – PPO | Admitting: Internal Medicine

## 2019-02-12 VITALS — BP 150/90 | HR 64 | Temp 98.3°F | Resp 20 | Ht 69.0 in | Wt 232.8 lb

## 2019-02-12 DIAGNOSIS — D125 Benign neoplasm of sigmoid colon: Secondary | ICD-10-CM | POA: Diagnosis not present

## 2019-02-12 DIAGNOSIS — D120 Benign neoplasm of cecum: Secondary | ICD-10-CM | POA: Diagnosis not present

## 2019-02-12 DIAGNOSIS — D123 Benign neoplasm of transverse colon: Secondary | ICD-10-CM

## 2019-02-12 DIAGNOSIS — Z1211 Encounter for screening for malignant neoplasm of colon: Secondary | ICD-10-CM | POA: Diagnosis not present

## 2019-02-12 MED ORDER — SODIUM CHLORIDE 0.9 % IV SOLN: 500.0000 mL | Freq: Once | INTRAVENOUS | Status: DC

## 2019-02-12 NOTE — Progress Notes (Signed)
Called to room to assist during endoscopic procedure.  Patient ID and intended procedure confirmed with present staff. Received instructions for my participation in the procedure from the performing physician.  

## 2019-02-12 NOTE — Progress Notes (Signed)
PT taken to PACU. Monitors in place. VSS. Report given to RN. 

## 2019-02-12 NOTE — Op Note (Signed)
[facility] Patient Name: Caitlyn Thomas Procedure Date: 02/12/2019 2:13 PM MRN: [id number] Endoscopist: Docia Chuck. Henrene Pastor , MD Age: 63 Referring MD:  Date of Birth: 07-Apr-1955 Gender: Female Account #: [id number] Procedure:                Colonoscopy with cold snare polypectomy x 3; with                            biopsies Indications:              Screening for colorectal malignant neoplasm Medicines:                Monitored Anesthesia Care Procedure:                Pre-Anesthesia Assessment:                           - Prior to the procedure, a History and Physical                            was performed, and patient medications and                            allergies were reviewed. The patient's tolerance of                            previous anesthesia was also reviewed. The risks                            and benefits of the procedure and the sedation                            options and risks were discussed with the patient.                            All questions were answered, and informed consent                            was obtained. Prior Anticoagulants: The patient has                            taken no previous anticoagulant or antiplatelet                            agents. After reviewing the risks and benefits, the                            patient was deemed in satisfactory condition to                            undergo the procedure.                           After obtaining informed consent, the colonoscope  was passed under direct vision. Throughout the                            procedure, the patient's blood pressure, pulse, and                            oxygen saturations were monitored continuously. The                            Colonoscope was introduced through the anus and                            advanced to the the cecum, identified by                            appendiceal orifice and ileocecal valve.  The                            ileocecal valve, appendiceal orifice, and rectum                            were photographed. The quality of the bowel                            preparation was excellent. The colonoscopy was                            performed without difficulty. The patient tolerated                            the procedure well. The bowel preparation used was                            SUPREP via single dose instruction. Scope In: 2:21:47 PM Scope Out: 2:44:41 PM Scope Withdrawal Time: 0 hours 16 minutes 38 seconds  Total Procedure Duration: 0 hours 22 minutes 54 seconds  Findings:                 A 1 mm polyp was found in the ileocecal valve.                            Biopsies were taken with a cold forceps for                            histology.                           Three polyps were found in the sigmoid colon and                            transverse colon. The polyps were 2 to 5 mm in                            size. These polyps were removed with a  cold snare.                            Resection and retrieval were complete.                           Multiple diverticula were found in the left colon                            and right colon.                           The exam was otherwise without abnormality on                            direct and retroflexion views. Complications:            No immediate complications. Estimated blood loss:                            None. Estimated Blood Loss:     Estimated blood loss: none. Impression:               - One 1 mm polyp at the ileocecal valve. Biopsied.                           - Three 2 to 5 mm polyps in the sigmoid colon and                            in the transverse colon, removed with a cold snare.                            Resected and retrieved.                           - Diverticulosis in the left colon and in the right                            colon.                           - The  examination was otherwise normal on direct                            and retroflexion views. Recommendation:           - Repeat colonoscopy in 5 years for surveillance.                           - Patient has a contact number available for                            emergencies. The signs and symptoms of potential                            delayed complications were discussed with the  patient. Return to normal activities tomorrow.                            Written discharge instructions were provided to the                            patient.                           - Resume previous diet.                           - Continue present medications.                           - Await pathology results. Docia Chuck. Henrene Pastor, MD 02/12/2019 2:51:03 PM This report has been signed electronically.

## 2019-02-12 NOTE — Patient Instructions (Signed)
Discharge instructions given. Handouts on polyps and diverticulosis. Resume previous medications. YOU HAD AN ENDOSCOPIC PROCEDURE TODAY AT [facility]:   Refer to the procedure report that was given to you for any specific questions about what was found during the examination.  If the procedure report does not answer your questions, please call your gastroenterologist to clarify.  If you requested that your care partner not be given the details of your procedure findings, then the procedure report has been included in a sealed envelope for you to review at your convenience later.  YOU SHOULD EXPECT: Some feelings of bloating in the abdomen. Passage of more gas than usual.  Walking can help get rid of the air that was put into your GI tract during the procedure and reduce the bloating. If you had a lower endoscopy (such as a colonoscopy or flexible sigmoidoscopy) you may notice spotting of blood in your stool or on the toilet paper. If you underwent a bowel prep for your procedure, you may not have a normal bowel movement for a few days.  Please Note:  You might notice some irritation and congestion in your nose or some drainage.  This is from the oxygen used during your procedure.  There is no need for concern and it should clear up in a day or so.  SYMPTOMS TO REPORT IMMEDIATELY:   Following lower endoscopy (colonoscopy or flexible sigmoidoscopy):  Excessive amounts of blood in the stool  Significant tenderness or worsening of abdominal pains  Swelling of the abdomen that is new, acute  Fever of 100F or higher   For urgent or emergent issues, a gastroenterologist can be reached at any hour by calling [phone].   DIET:  We do recommend a small meal at first, but then you may proceed to your regular diet.  Drink plenty of fluids but you should avoid alcoholic beverages for 24 hours.  ACTIVITY:  You should plan to take it easy for the rest of today and you should NOT  DRIVE or use heavy machinery until tomorrow (because of the sedation medicines used during the test).    FOLLOW UP: Our staff will call the number listed on your records 48-72 hours following your procedure to check on you and address any questions or concerns that you may have regarding the information given to you following your procedure. If we do not reach you, we will leave a message.  We will attempt to reach you two times.  During this call, we will ask if you have developed any symptoms of COVID 19. If you develop any symptoms (ie: fever, flu-like symptoms, shortness of breath, cough etc.) before then, please call [phone].  If you test positive for Covid 19 in the 2 weeks post procedure, please call and report this information to [place].    If any biopsies were taken you will be contacted by phone or by letter within the next 1-3 weeks.  Please call us at [phone] if you have not heard about the biopsies in 3 weeks.    SIGNATURES/CONFIDENTIALITY: You and/or your care partner have signed paperwork which will be entered into your electronic medical record.  These signatures attest to the fact that that the information above on your After Visit Summary has been reviewed and is understood.  Full responsibility of the confidentiality of this discharge information lies with you and/or your care-partner.

## 2019-02-13 DIAGNOSIS — E210 Primary hyperparathyroidism: Secondary | ICD-10-CM | POA: Diagnosis not present

## 2019-02-13 DIAGNOSIS — C73 Malignant neoplasm of thyroid gland: Secondary | ICD-10-CM | POA: Diagnosis not present

## 2019-02-14 ENCOUNTER — Telehealth: Payer: Self-pay | Admitting: *Deleted

## 2019-02-14 ENCOUNTER — Telehealth: Payer: Self-pay

## 2019-02-14 NOTE — Telephone Encounter (Signed)
  Follow up Call-  Call back number 02/12/2019  Post procedure Call Back phone  # [phone]  Permission to leave phone message Yes  Some recent data might be hidden     Patient questions:  Do you have a fever, pain , or abdominal swelling? No. Pain Score  0 *  Have you tolerated food without any problems? Yes.    Have you been able to return to your normal activities? Yes.    Do you have any questions about your discharge instructions: Diet   No. Medications  No. Follow up visit  No.  Do you have questions or concerns about your Care? No.  Actions: * If pain score is 4 or above: No action needed, pain <4.  1. Have you developed a fever since your procedure? no  2.   Have you had an respiratory symptoms (SOB or cough) since your procedure? no  3.   Have you tested positive for COVID 19 since your procedure no  4.   Have you had any family members/close contacts diagnosed with the COVID 19 since your procedure?  no   If yes to any of these questions please route to Joylene John, RN and Alphonsa Gin, Therapist, sports.

## 2019-02-14 NOTE — Telephone Encounter (Signed)
First attempt follow up call to pt, lm on vm 

## 2019-02-15 ENCOUNTER — Encounter: Payer: Self-pay | Admitting: Internal Medicine

## 2019-02-19 ENCOUNTER — Ambulatory Visit (INDEPENDENT_AMBULATORY_CARE_PROVIDER_SITE_OTHER): Payer: BC Managed Care – PPO | Admitting: Obstetrics & Gynecology

## 2019-02-19 ENCOUNTER — Other Ambulatory Visit: Payer: Self-pay

## 2019-02-19 ENCOUNTER — Encounter: Payer: Self-pay | Admitting: Obstetrics & Gynecology

## 2019-02-19 VITALS — BP 138/80 | HR 83 | Temp 97.7°F | Resp 16 | Ht 70.0 in | Wt 236.0 lb

## 2019-02-19 DIAGNOSIS — L299 Pruritus, unspecified: Secondary | ICD-10-CM

## 2019-02-19 DIAGNOSIS — Z01419 Encounter for gynecological examination (general) (routine) without abnormal findings: Secondary | ICD-10-CM | POA: Diagnosis not present

## 2019-02-19 DIAGNOSIS — Z124 Encounter for screening for malignant neoplasm of cervix: Secondary | ICD-10-CM | POA: Diagnosis not present

## 2019-02-19 DIAGNOSIS — Z8541 Personal history of malignant neoplasm of cervix uteri: Secondary | ICD-10-CM

## 2019-02-19 DIAGNOSIS — Z1151 Encounter for screening for human papillomavirus (HPV): Secondary | ICD-10-CM | POA: Diagnosis not present

## 2019-02-19 NOTE — Progress Notes (Signed)
Subjective:     Caitlyn Thomas is a 63 y.o. female here for a routine exam.  Current complaints: wants to know screening for anal and throat cancer given her history of cervical carcinoma in situ 30 years ago.  Complaining of itching on palms, soles, arms.     Gynecologic History No LMP recorded (lmp unknown). Patient has had a hysterectomy. Contraception: status post hysterectomy Last Pap: 8 years ago--all have been normal since hysterectomy 18 years ago and she stopped routine screening. Last mammogram: abnormal in July 202--now wants to proceed with biopsy.   Obstetric History OB History  Gravida Para Term Preterm AB Living  2 2 2         SAB TAB Ectopic Multiple Live Births               # Outcome Date GA Lbr Len/2nd Weight Sex Delivery Anes PTL Lv  2 Term      Vag-Spont     1 Term      Vag-Spont        The following portions of the patient's history were reviewed and updated as appropriate: allergies, current medications, past family history, past medical history, past social history, past surgical history and problem list.  Review of Systems Pertinent items noted in HPI and remainder of comprehensive ROS otherwise negative.    Objective:      Vitals:   02/19/19 0838  BP: 138/80  Pulse: 83  Resp: 16  Temp: 97.7 F (36.5 C)  TempSrc: Temporal  Weight: 236 lb (107 kg)  Height: 5\' 10"  (1.778 m)   Vitals:  WNL General appearance: alert, cooperative and no distress  HEENT: Normocephalic, without obvious abnormality, atraumatic Eyes: negative Throat: lips, mucosa, and tongue normal; teeth and gums normal  Respiratory: Clear to auscultation bilaterally  CV: Regular rate and rhythm  Breasts:  Normal appearance, no masses or tenderness, no nipple retraction or dimpling  GI: Soft, non-tender; bowel sounds normal; no masses,  no organomegaly  GU: External Genitalia:  Tanner V, old scarring from hydradinitis supperativa Urethra:  No prolapse   Vagina: Pink, normal  rugae, thick curd like discharge, no odor  Cervix: Surgically absent  Uterus:  Surgically absent  Adnexa: Normal, no masses, non tender  Musculoskeletal: No edema, redness or tenderness in the calves or thighs  Skin: No lesions or rash  Lymphatic: Axillary adenopathy: none     Psychiatric: Normal mood and behavior    Assessment:    Healthy female exam.   Wants screening for anal cancer urticaria   Plan:  1.  Normal cuff; Bd Affirm for discharge. Vaginal pap smear not needed this far out from surgery. 2.  Anal pap smear 3.  Check bile acids and CMP; suggest derm referral if not better with hydrocortisone cream and AmLactin lotion, keep moisturized.

## 2019-02-20 LAB — CERVICOVAGINAL ANCILLARY ONLY
Bacterial Vaginitis (gardnerella): POSITIVE — AB
Candida Glabrata: NEGATIVE
Candida Vaginitis: POSITIVE — AB
Comment: NEGATIVE
Comment: NEGATIVE
Comment: NEGATIVE

## 2019-02-21 ENCOUNTER — Other Ambulatory Visit: Payer: Self-pay | Admitting: *Deleted

## 2019-02-21 ENCOUNTER — Telehealth: Payer: Self-pay | Admitting: *Deleted

## 2019-02-21 MED ORDER — METRONIDAZOLE 500 MG PO TABS: 500.0000 mg | ORAL_TABLET | Freq: Two times a day (BID) | ORAL | 0 refills | Status: DC

## 2019-02-21 MED ORDER — FLUCONAZOLE 150 MG PO TABS: 150.0000 mg | ORAL_TABLET | Freq: Once | ORAL | 0 refills | Status: AC

## 2019-02-21 NOTE — Telephone Encounter (Signed)
Pt has positive yeast and Diflucan 150 mg sent to [facility] in [place] per Dr Gala Romney.

## 2019-02-25 LAB — COMPREHENSIVE METABOLIC PANEL
AG Ratio: 1.8 (calc) (ref 1.0–2.5)
ALT: 20 U/L (ref 6–29)
AST: 15 U/L (ref 10–35)
Albumin: 4.3 g/dL (ref 3.6–5.1)
Alkaline phosphatase (APISO): 77 U/L (ref 37–153)
BUN: 10 mg/dL (ref 7–25)
CO2: 27 mmol/L (ref 20–32)
Calcium: 9.5 mg/dL (ref 8.6–10.4)
Chloride: 100 mmol/L (ref 98–110)
Creat: 0.77 mg/dL (ref 0.50–0.99)
Globulin: 2.4 g/dL (calc) (ref 1.9–3.7)
Glucose, Bld: 122 mg/dL — ABNORMAL HIGH (ref 65–99)
Potassium: 4.5 mmol/L (ref 3.5–5.3)
Sodium: 137 mmol/L (ref 135–146)
Total Bilirubin: 0.4 mg/dL (ref 0.2–1.2)
Total Protein: 6.7 g/dL (ref 6.1–8.1)

## 2019-02-25 LAB — BILE ACIDS, TOTAL: Bile Acids Total: 13 umol/L (ref 0–19)

## 2019-02-27 LAB — CYTOLOGY - PAP
Comment: NEGATIVE
Diagnosis: NEGATIVE
High risk HPV: NEGATIVE

## 2019-03-26 ENCOUNTER — Encounter (HOSPITAL_BASED_OUTPATIENT_CLINIC_OR_DEPARTMENT_OTHER): Payer: BC Managed Care – PPO | Admitting: Internal Medicine

## 2019-03-30 DIAGNOSIS — B379 Candidiasis, unspecified: Secondary | ICD-10-CM

## 2019-04-02 MED ORDER — FLUCONAZOLE 150 MG PO TABS: 150.0000 mg | ORAL_TABLET | Freq: Once | ORAL | 0 refills | Status: AC

## 2019-04-02 NOTE — Telephone Encounter (Signed)
Diflucan sent per Dr.Leggett

## 2019-04-04 ENCOUNTER — Other Ambulatory Visit: Payer: BC Managed Care – PPO

## 2020-04-02 ENCOUNTER — Encounter: Payer: Self-pay | Admitting: Osteopathic Medicine

## 2020-04-02 ENCOUNTER — Ambulatory Visit (INDEPENDENT_AMBULATORY_CARE_PROVIDER_SITE_OTHER): Payer: 59 | Admitting: Osteopathic Medicine

## 2020-04-02 ENCOUNTER — Other Ambulatory Visit: Payer: Self-pay

## 2020-04-02 VITALS — BP 132/83 | HR 76 | Temp 98.2°F | Wt 235.1 lb

## 2020-04-02 DIAGNOSIS — E559 Vitamin D deficiency, unspecified: Secondary | ICD-10-CM

## 2020-04-02 DIAGNOSIS — R928 Other abnormal and inconclusive findings on diagnostic imaging of breast: Secondary | ICD-10-CM

## 2020-04-02 DIAGNOSIS — E892 Postprocedural hypoparathyroidism: Secondary | ICD-10-CM

## 2020-04-02 DIAGNOSIS — R0681 Apnea, not elsewhere classified: Secondary | ICD-10-CM

## 2020-04-02 DIAGNOSIS — R030 Elevated blood-pressure reading, without diagnosis of hypertension: Secondary | ICD-10-CM | POA: Diagnosis not present

## 2020-04-02 DIAGNOSIS — Z0000 Encounter for general adult medical examination without abnormal findings: Secondary | ICD-10-CM | POA: Diagnosis not present

## 2020-04-02 NOTE — Progress Notes (Signed)
HPI: Caitlyn Thomas is a 65 y.o. female who  has a past medical history of Abnormal mammogram (01/23/2019), ADD (attention deficit disorder), Arthritis, H/O: hysterectomy (01/23/2019), HPV (human papilloma virus) infection, Papillary thyroid carcinoma ([place]) (01/23/2019), S/P parathyroidectomy ([place]) (01/23/2019), and UTI (lower urinary tract infection).  she presents to [facility] today, 04/02/20,  for chief complaint of:  Annual Physical  Pt has not been seen since 2020 due to lack of insurance in 2021. She is interested in following up on endocrinology and mammogram recommendations that she was unable to follow up on due to lack of insurance previously .  Apneic episodes History of loud snoring and patient's husband reports she occasionally stops breathing in her sleep.   Abnormal mammogram  Pt has hx of abnormal mammogram with recommended ultrasounds that have not been completed. Pt reports occasional itching of breasts. Denies: nipple or other skin changes, discharge, palpable lumps  Skin lesions Pt has a spot that appeared on her outer R thigh about 8 months ago in June 2021. It started out as a small dark red/purple, scaling lesion that grew over the following weeks to a large red scaling patch. Since then it has faded in color to a light pink patch covering the outer R thigh.  A second spot appeared about 6 months ago on the R inner thigh in August 2021. This is a darker lesion that's purple red in color. It has not changed in size since onset.  Both spots are occasionally itchy. No personal history of skin cancer. Family history basal cell carcinoma in her brother. History f significant sun exposure throughout life time.    Past medical, surgical, social and family history reviewed:  Patient Active Problem List   Diagnosis Date Noted  . Thyroid cancer ([place]) 02/06/2019  . Papillary thyroid carcinoma ([place]) 01/23/2019  . S/P parathyroidectomy  ([place]) 01/23/2019  . Abnormal mammogram 01/23/2019  . History of HPV infection 01/23/2019  . H/O: hysterectomy 01/23/2019  . Vitamin D deficiency 10/31/2018  . Hyperparathyroidism, primary ([place]) 10/31/2018  . Need for varicella vaccine 08/16/2018  . History of ADHD 08/16/2018  . Rosacea 08/16/2018  . Weight gain 08/16/2018  . Idiopathic urticaria 08/16/2018  . Elevated blood pressure reading 08/16/2018    Past Surgical History:  Procedure Laterality Date  . ABDOMINAL HYSTERECTOMY  1986   patient denies this surgery  . COLONOSCOPY    . COMBINED HYSTEROSCOPY DIAGNOSTIC / D&C     w/ cone bx  . NASAL SEPTOPLASTY W/ TURBINOPLASTY    . NASAL SINUS SURGERY  1992  . REPLACEMENT TOTAL KNEE Left 01/2018  . REPLACEMENT TOTAL KNEE Left 01/2018  . TOTAL VAGINAL HYSTERECTOMY  1986    Social History   Tobacco Use  . Smoking status: Former Smoker    Types: Cigarettes    Quit date: 1985    Years since quitting: 37.1  . Smokeless tobacco: Never Used  Substance Use Topics  . Alcohol use: Yes    Comment: 2 bottles wine/week    Family History  Problem Relation Age of Onset  . Heart attack Sister   . Diabetes Maternal Grandmother   . Heart attack Paternal Grandmother   . Colon cancer Neg Hx   . Rectal cancer Neg Hx   . Stomach cancer Neg Hx      Current medication list and allergy/intolerance information reviewed:    Current Outpatient Medications  Medication Sig Dispense Refill  . levothyroxine (SYNTHROID) 150 MCG tablet Take  by mouth.    . metroNIDAZOLE (METROGEL) 0.75 % gel Apply 1 application topically 2 (two) times daily. 45 g 11   No current facility-administered medications for this visit.    No Known Allergies    Review of Systems:  Constitutional:  No unintentional weight changes.   HEENT: No  headache  Cardiac: No  chest pain, No  pressure, No palpitations  Respiratory:  No  shortness of breath.   Gastrointestinal: No  abdominal pain,  No  blood in stool,  +minor occasional diarrhea, No  constipation   Musculoskeletal: No new myalgia/arthralgia  Skin: No  Rash, + other concerning lesions on R outer and inner thigh  Endocrine: No cold intolerance,  No heat intolerance. No polyuria/polydipsia/polyphagia   Psychiatric: No  concerns with depression, No  concerns with anxiety, No sleep problems, No mood problems  Exam:  BP 132/83 (BP Location: Left Arm, Patient Position: Sitting, Cuff Size: Large)   Pulse 76   Temp 98.2 F (36.8 C) (Oral)   Wt 235 lb 1.3 oz (106.6 kg)   LMP  (LMP Unknown)   BMI 33.73 kg/m   Constitutional: VS see above. General Appearance: alert, well-developed, well-nourished, NAD  Eyes: Normal lids and conjunctive, non-icteric sclera  Neck: No masses, trachea midline. No thyroid enlargement. No tenderness/mass appreciated. No lymphadenopathy  Respiratory: Normal respiratory effort. no wheeze, no rhonchi, no rales  Cardiovascular: S1/S2 normal, no murmur, no rub/gallop auscultated. RRR. No lower extremity edema.   Gastrointestinal: Nontender, no masses. No hepatomegaly, no splenomegaly. No hernia appreciated. . Rectal exam deferred.   Musculoskeletal: Gait normal. No clubbing/cyanosis of digits.   Skin: warm, dry, intact. Light pink dry patch on outer R thigh. Small purple/red patch about 2 cm x 0.5 cm.   Psychiatric: Normal judgment/insight. Normal mood and affect. Oriented x3.    No results found for this or any previous visit (from the past 72 hour(s)).  No results found.   ASSESSMENT/PLAN: The primary encounter diagnosis was Annual physical exam. Diagnoses of S/P parathyroidectomy ([place]), Abnormal mammogram, Elevated blood pressure reading, Apneic episode, and Vitamin D deficiency were also pertinent to this visit.   Health maintenance   Lipid panel   CBC, CMP  TSH, free T4, PTH  Vitamin D level   A1C  HIV and hepatitis C testing   Follow up in 1 year for annual physical   Apneic episodes  and elevated bp reading  Referral for sleep study to rule out OSA   abnormal mammogram  Referral to breast center for repeat mammogram and ultrasound  Thyroid and parathyroid  Pt given information to contact endocrinologist for follow up - labs also   Skin lesions - no concern for cancer, improving on own  Follow up if symptoms worsen or do not improve  Will consider topical antifungal v topical corticosteroid if symptoms do not improve. Pt declines antifungal today   Orders Placed This Encounter  Procedures  . MM Digital Diagnostic Bilat  . US BREAST COMPLETE UNI RIGHT INC AXILLA  . US BREAST COMPLETE UNI LEFT INC AXILLA  . COMPLETE METABOLIC PANEL WITH GFR  . CBC with Differential/Platelet  . TSH  . Lipid panel  . T4, free  . Hemoglobin A1c  . VITAMIN D 25 Hydroxy (Vit-D Deficiency, Fractures)  . Hepatitis C antibody  . HIV Antibody (routine testing w rflx)  . Parathyroid hormone, intact (no Ca)  . Home sleep test    No orders of the defined types were placed in  this encounter.   Patient Instructions  Mammogram follow-up ordered, you will get a call from the [place]  [facility]ocrinology please call [facility] (shouldn't need new referral) Dr Paticia Stack [phone]  Skin no concern for cancer! Let me know if changes!  Sleep study reordered, please let us know if you don't get a call to set this up   General Preventive Care  Most recent routine screening labs: ordered today.   Blood pressure goal around 130/80, definitely under 140/90.   Tobacco: don't!   Alcohol: responsible moderation is ok for most adults - if you have concerns about your alcohol intake, please talk to me!   Exercise: as tolerated  Mental health: if need for mental health care (medicines, counseling, other), or concerns about moods, please let me know!   Sexual / Reproductive health: if need for STD testing, or if concerns with libido/pain problems, please let me know! If you need to discuss  family planning, please let me know!   Advanced Directive: Living Will and/or [place] recommended for all adults Vaccines  Flu vaccine: for almost everyone, every fall.   Shingles vaccine: done!  Pneumonia vaccines: after age 32.  Tetanus booster: due 2029!  COVID vaccine: THANKS for getting your vaccine! :)  Cancer screenings   Colon cancer screening: Colonoscopy due 01/2024!  Breast cancer screening: due! Ordered   Cervical cancer screening: Pap per OBGYN Infection screenings  . HIV: recommended screening at least once age 49-65 . Gonorrhea/Chlamydia: screening as needed . Hepatitis C: recommended once for everyone age 4-75 . TB: certain at-risk populations Other . Bone Density Test: recommended for women at age 52       Visit summary with medication list and pertinent instructions was printed for patient to review. All questions at time of visit were answered - patient instructed to contact office with any additional concerns or updates. ER/RTC precautions were reviewed with the patient.    Please note: voice recognition software was used to produce this document, and typos may escape review. Please contact Dr. Sheppard Coil for any needed clarifications.     Follow-up plan: Return in about 1 year (around 04/02/2021) for ANNUAL CHECK-UP - SEE [place] SOONER IF NEEDED.

## 2020-04-02 NOTE — Patient Instructions (Addendum)
Mammogram follow-up ordered, you will get a call from the [place]  [facility]ocrin[facility] please call [facility] (shouldn't need new referral) Dr Paticia Stack [phone]  Skin no concern for cancer! Let me know if changes!  Sleep study reordered, please let us know if you don't get a call to set this up   General Preventive Care  Most recent routine screening labs: ordered today.   Blood pressure goal around 130/80, definitely under 140/90.   Tobacco: don't!   Alcohol: responsible moderation is ok for most adults - if you have concerns about your alcohol intake, please talk to me!   Exercise: as tolerated  Mental health: if need for mental health care (medicines, counseling, other), or concerns about moods, please let me know!   Sexual / Reproductive health: if need for STD testing, or if concerns with libido/pain problems, please let me know! If you need to discuss family planning, please let me know!   Advanced Directive: Living Will and/or [place] recommended for all adults Vaccines  Flu vaccine: for almost everyone, every fall.   Shingles vaccine: done!  Pneumonia vaccines: after age 79.  Tetanus booster: due 2029!  COVID vaccine: THANKS for getting your vaccine! :)  Cancer screenings   Colon cancer screening: Colonoscopy due 01/2024!  Breast cancer screening: due! Ordered   Cervical cancer screening: Pap per OBGYN Infection screenings  . HIV: recommended screening at least once age 87-65 . Gonorrhea/Chlamydia: screening as needed . Hepatitis C: recommended once for everyone age 47-75 . TB: certain at-risk populations Other . Bone Density Test: recommended for women at age 69

## 2020-04-03 LAB — COMPLETE METABOLIC PANEL WITH GFR
AG Ratio: 2 (calc) (ref 1.0–2.5)
ALT: 25 U/L (ref 6–29)
AST: 17 U/L (ref 10–35)
Albumin: 4.3 g/dL (ref 3.6–5.1)
Alkaline phosphatase (APISO): 62 U/L (ref 37–153)
BUN: 14 mg/dL (ref 7–25)
CO2: 27 mmol/L (ref 20–32)
Calcium: 9.1 mg/dL (ref 8.6–10.4)
Chloride: 103 mmol/L (ref 98–110)
Creat: 0.62 mg/dL (ref 0.50–0.99)
GFR, Est African American: 111 mL/min/{1.73_m2} (ref >=60)
GFR, Est Non African American: 96 mL/min/{1.73_m2} (ref >=60)
Globulin: 2.2 g/dL (calc) (ref 1.9–3.7)
Glucose, Bld: 86 mg/dL (ref 65–139)
Potassium: 4.3 mmol/L (ref 3.5–5.3)
Sodium: 139 mmol/L (ref 135–146)
Total Bilirubin: 0.4 mg/dL (ref 0.2–1.2)
Total Protein: 6.5 g/dL (ref 6.1–8.1)

## 2020-04-03 LAB — HIV ANTIBODY (ROUTINE TESTING W REFLEX): HIV 1&2 Ab, 4th Generation: NONREACTIVE

## 2020-04-03 LAB — HEMOGLOBIN A1C
Hgb A1c MFr Bld: 5.7 % of total Hgb — ABNORMAL HIGH (ref <5.7)
Mean Plasma Glucose: 117 mg/dL
eAG (mmol/L): 6.5 mmol/L

## 2020-04-03 LAB — CBC WITH DIFFERENTIAL/PLATELET
Absolute Monocytes: 671 cells/uL (ref 200–950)
Basophils Absolute: 17 cells/uL (ref 0–200)
Basophils Relative: 0.2 %
Eosinophils Absolute: 146 cells/uL (ref 15–500)
Eosinophils Relative: 1.7 %
HCT: 39.8 % (ref 35.0–45.0)
Hemoglobin: 14 g/dL (ref 11.7–15.5)
Lymphs Abs: 1961 cells/uL (ref 850–3900)
MCH: 32.4 pg (ref 27.0–33.0)
MCHC: 35.2 g/dL (ref 32.0–36.0)
MCV: 92.1 fL (ref 80.0–100.0)
MPV: 10.1 fL (ref 7.5–12.5)
Monocytes Relative: 7.8 %
Neutro Abs: 5805 cells/uL (ref 1500–7800)
Neutrophils Relative %: 67.5 %
Platelets: 322 10*3/uL (ref 140–400)
RBC: 4.32 10*6/uL (ref 3.80–5.10)
RDW: 11.7 % (ref 11.0–15.0)
Total Lymphocyte: 22.8 %
WBC: 8.6 10*3/uL (ref 3.8–10.8)

## 2020-04-03 LAB — TSH: TSH: 0.03 mIU/L — ABNORMAL LOW (ref 0.40–4.50)

## 2020-04-03 LAB — LIPID PANEL
Cholesterol: 226 mg/dL — ABNORMAL HIGH (ref <200)
HDL: 64 mg/dL (ref >=50)
LDL Cholesterol (Calc): 136 mg/dL (calc) — ABNORMAL HIGH
Non-HDL Cholesterol (Calc): 162 mg/dL (calc) — ABNORMAL HIGH (ref <130)
Total CHOL/HDL Ratio: 3.5 (calc) (ref <5.0)
Triglycerides: 137 mg/dL (ref <150)

## 2020-04-03 LAB — VITAMIN D 25 HYDROXY (VIT D DEFICIENCY, FRACTURES): Vit D, 25-Hydroxy: 30 ng/mL (ref 30–100)

## 2020-04-03 LAB — PARATHYROID HORMONE, INTACT (NO CA): PTH: 127 pg/mL — ABNORMAL HIGH (ref 14–64)

## 2020-04-03 LAB — T4, FREE: Free T4: 1.9 ng/dL — ABNORMAL HIGH (ref 0.8–1.8)

## 2020-04-03 LAB — HEPATITIS C ANTIBODY
Hepatitis C Ab: NONREACTIVE
SIGNAL TO CUT-OFF: 0.01 (ref <1.00)

## 2020-05-16 ENCOUNTER — Ambulatory Visit
Admission: RE | Admit: 2020-05-16 | Discharge: 2020-05-16 | Disposition: A | Discharge status: Home / Self Care | Payer: 59 | Source: Ambulatory Visit | Attending: Osteopathic Medicine | Admitting: Osteopathic Medicine

## 2020-05-16 ENCOUNTER — Other Ambulatory Visit: Payer: Self-pay

## 2020-08-12 ENCOUNTER — Encounter: Payer: Self-pay | Admitting: Physician Assistant

## 2020-08-12 ENCOUNTER — Encounter: Payer: Self-pay | Admitting: Osteopathic Medicine

## 2020-08-12 ENCOUNTER — Telehealth (INDEPENDENT_AMBULATORY_CARE_PROVIDER_SITE_OTHER): Payer: 59 | Admitting: Physician Assistant

## 2020-08-12 ENCOUNTER — Other Ambulatory Visit: Payer: Self-pay

## 2020-08-12 DIAGNOSIS — U071 COVID-19: Secondary | ICD-10-CM | POA: Insufficient documentation

## 2020-08-12 MED ORDER — NIRMATRELVIR/RITONAVIR (PAXLOVID)TABLET: 3.0000 | ORAL_TABLET | Freq: Two times a day (BID) | ORAL | 0 refills | Status: AC

## 2020-08-12 NOTE — Progress Notes (Signed)
Pt tested positive this morning. She stated that she has been sick since Friday.   Her sxs have been cough, that got worse, chills,fever,diarrhea(sun/mon), runny nose, congestion., sore throat. Productive cough greenish yellow  She has been taking Robitussin,mucinex,tylenol, benadryl, and sudafed.

## 2020-08-12 NOTE — Progress Notes (Signed)
Telemedicine Visit via  Video & Audio (App used: Caregility/MyChart) Audio only - telephone (patient preference /  technical difficulty with MyChart video application)  I connected with Caitlyn Thomas on 08/12/20 at 2:10 PM  by  telemedicine application as noted above  I verified that I am speaking with or regarding  the correct patient using two identifiers.  Participants: Myself, Caitlyn Chimes PA-C Patient: Caitlyn Thomas Patient proxy if applicable: N/A Other, if applicable: N/A  Patient is at home I am in office at [facility]    I discussed the limitations of evaluation and management  by telemedicine and the availability of in person appointments.  The participant(s) above expressed understanding and  agreed to proceed with this appointment via telemedicine.       History of Present Illness: Caitlyn Thomas is a 65 y.o. female who would like to discuss COVID symptoms   Symptom onset Friday 08/08/20 - today is day 4 Tested + today 08/12/20 at local pharmacy Reports gradually worsening cough productive of yellow sputum Endorses tactile fevers on Saturday that have resolved  Endorses multiple episodes of loose, watery brown diarrhea x 2 days Has been on liquid diet but tolerated solid food today, voiding normally Reports fecal and urinary incontinence with coughing Endorses sore throat and hoarseness without dysphagia/choking Endorses rib tightness - denies dyspnea or pleuritic chest pain Taking Tylenol, Robitussin, Sudafed, Benadryl without much relief Fully immunized and boosted. No prior hx of COVID or PNA or cardiopulmonary disease Denies hx of hepatic or renal disease      Observations/Objective: LMP  (LMP Unknown)  BP Readings from Last 3 Encounters:  04/02/20 132/83  02/19/19 138/80  02/12/19 (!) 150/90   Exam: limited by telehealth Alert and oriented x 3 Speech articulate with normal rate, speaking in full sentences   Lab and  Radiology Results No results found for this or any previous visit (from the past 72 hour(s)). No results found.  Lab Results  Component Value Date   CREATININE 0.62 04/02/2020   BUN 14 04/02/2020   NA 139 04/02/2020   K 4.3 04/02/2020   CL 103 04/02/2020   CO2 27 04/02/2020      Assessment and Plan: 65 y.o. female with The encounter diagnosis was COVID-19 virus infection.  Reviewed risks, benefits and contraindications of Paxlovid with patient and sent EUA information via [place] patient to discuss in more detail with pharmacist No renal or hepatic impairment, no medical contraindications Risk factors for COVID complications/hopitalization: age 29 and BMI Counseled on supportive care as below - discussed risk of elevated BP with Sudafed and recommended switch to Mucinex and Phenylephrine prn Counseled on ER precautions Isolate until 08/13/20 and continue to wear a mask until 08/18/20 Follow-up prn   PDMP not reviewed this encounter. No orders of the defined types were placed in this encounter.  Meds ordered this encounter  Medications   nirmatrelvir/ritonavir EUA (PAXLOVID) TABS    Sig: Take 3 tablets by mouth 2 (two) times daily for 5 days. (Take nirmatrelvir 150 mg two tablets twice daily for 5 days and ritonavir 100 mg one tablet twice daily for 5 days) Patient GFR is 82    Dispense:  30 tablet    Refill:  0    Order Specific Question:   Supervising Provider    Answer:   Caitlyn Thomas [[id number]]   There are no Patient Instructions on file for this visit.  Instructions sent via MyChart.   Follow Up Instructions: No follow-ups  on file.    I discussed the assessment and treatment plan with the patient. The patient was provided an opportunity to ask questions and all were answered. The patient agreed with the plan and demonstrated an understanding of the instructions.   The patient was advised to call back or seek an in-person evaluation if any new  concerns, if symptoms worsen or if the condition fails to improve as anticipated.  29 minutes of non-face-to-face time was provided during this encounter.  Caitlyn Thomas,  Your prescription for Paxlovid was e-prescribed to [facility]. I called the pharmacy and confirmed that the medication is in stock.  Please review the below info about this Emergency Use Authorized antiviral medication You will take 3 tablets twice a day x 5 days Please discuss any additional questions you have about this prescription with the pharmacist  https://www.fda.gov/media/155051/download#:~:text=PAXLOVID%20is%20an%20investigational%54medicine,19%2C%20including%20hospitalization%20or%20death   For supportive care, I recommend you continue: Tylenol 500 mg every 6 hrs as needed for fever/chills/body aches/headache Mucinex as directed as needed for cough  Phenylephrine as directed for nasal congestion/ear fullness Robitussin as directed at bedtime as needed for nighttime cough Benadryl 25 mg at bedtime as needed for sneezing/runny nose Optional Imodium as directed for diarrhea Drink at least 1.5 liters of water/clear liquids per day - make sure urine is a pale yellow and you are urinating every few hours Bland diet until diarrhea resolves  Go to the nearest Emergency Room of call 911 for: Chest pain, shortness of breath, blue lips or finger tips, lethargy, confusion, severe headache, fainting or any severe/concerning symptoms  I hope you feel better! Caitlyn Chimes PA-C    . . . . . . . . . . . . . Marland Kitchen                   Historical information moved to improve visibility of documentation.  Past Medical History:  Diagnosis Date   Abnormal mammogram 01/23/2019   declined further w/u    ADD (attention deficit disorder)    Arthritis    bone on bone ([place])   H/O: hysterectomy 01/23/2019   "HPV" per patient, thinks CIN2?   HPV (human papilloma virus) infection     Papillary thyroid carcinoma ([place]) 01/23/2019   S/p total thyroidectomy at [facility] 2020, parathyroid   S/P parathyroidectomy ([place]) 01/23/2019   UTI (lower urinary tract infection)    Past Surgical History:  Procedure Laterality Date   ABDOMINAL HYSTERECTOMY  1986   patient denies this surgery   COLONOSCOPY     COMBINED HYSTEROSCOPY DIAGNOSTIC / D&C     w/ cone bx   NASAL SEPTOPLASTY W/ TURBINOPLASTY     NASAL SINUS SURGERY  1992   REPLACEMENT TOTAL KNEE Left 01/2018   REPLACEMENT TOTAL KNEE Left 01/2018   TOTAL VAGINAL HYSTERECTOMY  1986   Social History   Tobacco Use   Smoking status: Former    Pack years: 0.00    Types: Cigarettes    Quit date: 1985    Years since quitting: 37.4   Smokeless tobacco: Never  Substance Use Topics   Alcohol use: Yes    Comment: 2 bottles wine/week   family history includes Diabetes in her maternal grandmother; Heart attack in her paternal grandmother and sister.  Medications: Current Outpatient Medications  Medication Sig Dispense Refill   levothyroxine (SYNTHROID) 150 MCG tablet Take by mouth.     metroNIDAZOLE (METROGEL) 0.75 % gel Apply 1 application topically 2 (two) times daily. 45 g 11  nirmatrelvir/ritonavir EUA (PAXLOVID) TABS Take 3 tablets by mouth 2 (two) times daily for 5 days. (Take nirmatrelvir 150 mg two tablets twice daily for 5 days and ritonavir 100 mg one tablet twice daily for 5 days) Patient GFR is 82 30 tablet 0   No current facility-administered medications for this visit.   No Known Allergies   If phone visit, billing and coding can please add appropriate modifier if needed

## 2020-08-12 NOTE — Patient Instructions (Signed)
COVID-19: Quarantine and Isolation Quarantine If you were exposed Quarantine and stay away from others when you have been in close contact with someone whohas COVID-19. Isolate If you are sick or test positive Isolate when you are sick or when you have COVID-19, even if you don't have symptoms. When to stay home Calculating quarantine The date of your exposure is considered day 0. Day 1 is the first full day after your last contact with a person who has had COVID-19. Stay home and away from other people for at least 5 days. Learn why CDC updated guidance for the general public. IF YOU were exposed to COVID-19 and are NOT up-to-date IF YOU were exposed to COVID-19 and are NOT on COVID-19 vaccinations Quarantine for at least 5 days Stay home Stay home and quarantine for at least 5 full days. Wear a well-fitted mask if you must be around others in your home. Do not travel. Get tested Even if you don't develop symptoms, get tested at least 5 days after you last had close contact with someone with COVID-19. After quarantine Watch for symptoms Watch for symptoms until 10 days after you last had close contact with someone with COVID-19. Avoid travel It is best to avoid travel until a full 10 days after you last had close contact with someone with COVID-19. If you develop symptoms Isolate immediately and get tested. Continue to stay home until you know the results. Wear a well-fitted mask around others. Take precautions until day 10 Wear a mask Wear a well-fitted mask for 10 full days any time you are around others inside your home or in public. Do not go to places where you are unable to wear a mask. If you must travel during days 6-10, take precautions. Avoid being around people who are at high risk IF YOU were exposed to COVID-19 and are up-to-date IF YOU were exposed to COVID-19 and are on COVID-19 vaccinations No quarantine You do not need to stay home unless you develop  symptoms. Get tested Even if you don't develop symptoms, get tested at least 5 days after you last had close contact with someone with COVID-19. Watch for symptoms Watch for symptoms until 10 days after you last had close contact with someone with COVID-19. If you develop symptoms Isolate immediately and get tested. Continue to stay home until you know the results. Wear a well-fitted mask around others. Take precautions until day 10 Wear a mask Wear a well-fitted mask for 10 full days any time you are around others inside your home or in public. Do not go to places where you are unable to wear a mask. Take precautions if traveling Avoid being around people who are at high risk IF YOU were exposed to COVID-19 and had confirmed COVID-19 within the past 90 days (you tested positive using a viral test) No quarantine You do not need to stay home unless you develop symptoms. Watch for symptoms Watch for symptoms until 10 days after you last had close contact with someone with COVID-19. If you develop symptoms Isolate immediately and get tested. Continue to stay home until you know the results. Wear a well-fitted mask around others. Take precautions until day 10 Wear a mask Wear a well-fitted mask for 10 full days any time you are around others inside your home or in public. Do not go to places where you are unable to wear a mask. Take precautions if traveling Avoid being around people who are at high risk Calculating isolation  Day 0 is your first day of symptoms or a positive viral test. Day 1 is the first full day after your symptoms developed or your test specimen was collected. If you have COVID-19 or have symptoms, isolate for at least 5 days. IF YOU tested positive for COVID-19 or have symptoms, regardless of vaccination status Stay home for at least 5 days Stay home for 5 days and isolate from others in your home. Wear a well-fitted mask if you must be around others in your home. Do not  travel. Ending isolation if you had symptoms End isolation after 5 full days if you are fever-free for 24 hours (without the use of fever-reducing medication) and your symptoms are improving. Ending isolation if you did NOT have symptoms End isolation after at least 5 full days after your positive test. If you were severely ill with COVID-19 or are immunocompromised You should isolate for at least 10 days. Consult your doctor before ending isolation. Take precautions until day 10 Wear a mask Wear a well-fitted mask for 10 full days any time you are around others inside your home or in public. Do not go to places where you are unable to wear a mask. Do not travel Do not travel until a full 10 days after your symptoms started or the date your positive test was taken if you had no symptoms. Avoid being around people who are at high risk Definitions Exposure Contact with someone infected with SARS-CoV-2, the virus that causes COVID-19,in a way that increases the likelihood of getting infected with the virus. Close contact A close contact is someone who was less than 6 feet away from an infected person (laboratory-confirmed or a clinical diagnosis) for a cumulative total of 15 minutes or more over a 24-hour period. For example, three individual 5-minute exposures for a total of 15 minutes. People who are exposed to someone with COVID-19 after they completed at least 5 days of isolation are notconsidered close contacts. Julio Sicks is a strategy used to prevent transmission of COVID-19 by keeping people who have been in close contact with someone with COVID-19 apart from others. Who does not need to quarantine? If you had close contact with someone with COVID-19 and you are in one of the following groups, you do not need to quarantine. You are up to date with your COVID-19 vaccines. You had confirmed COVID-19 within the last 90 days (meaning you tested positive using a viral test). You  should wear a well-fitting mask around others for 10 days from the date of your last close contact with someone with COVID-19 (the date of last close contact is considered day 0). Get tested at least 5 days after you last had close contact with someone with COVID-19. If you test positive or develop COVID-19 symptoms, isolate from other people and follow recommendations in the Isolation section below. If you tested positive for COVID-19 with a viral test within the previous 90 days and subsequently recovered and remain without COVID-19 symptoms, you do not need to quarantine or get tested after close contact. You should wear a well-fitting mask around others for 10 days from the date of your last close contact withsomeone with COVID-19 (the date of last close contact is considered day 0). Who should quarantine? If you come into close contact with someone with COVID-19, you should quarantine if you are not up to date on COVID-19 vaccines. This includes people who are not vaccinated. What to do for quarantine Stay home and away  from other people for at least 5 days (day 0 through day 5) after your last contact with a person who has COVID-19. The date of your exposure is considered day 0. Wear a well-fitting mask when around others at home, if possible. For 10 days after your last close contact with someone with COVID-19, watch for fever (100.66F or greater), cough, shortness of breath, or other COVID-19 symptoms. If you develop symptoms, get tested immediately and isolate until you receive your test results. If you test positive, follow isolation recommendations. If you do not develop symptoms, get tested at least 5 days after you last had close contact with someone with COVID-19. If you test negative, you can leave your home, but continue to wear a well-fitting mask when around others at home and in public until 10 days after your last close contact with someone with COVID-19. If you test positive, you should  isolate for at least 5 days from the date of your positive test (if you do not have symptoms). If you do develop COVID-19 symptoms, isolate for at least 5 days from the date your symptoms began (the date the symptoms started is day 0). Follow recommendations in the isolation section below. If you are unable to get a test 5 days after last close contact with someone with COVID-19, you can leave your home after day 5 if you have been without COVID-19 symptoms throughout the 5-day period. Wear a well-fitting mask for 10 days after your date of last close contact when around others at home and in public. Avoid people who are immunocompromised or at high risk for severe disease, and nursing homes and other high-risk settings, until after at least 10 days. If possible, stay away from people you live with, especially people who are at higher risk for getting very sick from COVID-19, as well as others outside your home throughout the full 10 days after your last close contact with someone with COVID-19. If you are unable to quarantine, you should wear a well-fitting mask for 10 days when around others at home and in public. If you are unable to wear a mask when around others, you should continue to quarantine for 10 days. Avoid people who are immunocompromised or at high risk for severe disease, and nursing homes and other high-risk settings, until after at least 10 days. See additional information about travel. Do not go to places where you are unable to wear a mask, such as restaurants and some gyms, and avoid eating around others at home and at work until after 10 days after your last close contact with someone with COVID-19. After quarantine Watch for symptoms until 10 days after your last close contact with someone with COVID-19. If you have symptoms, isolate immediately and get tested. Quarantine in high-risk congregate settings In certain congregate settings that have high risk of secondary transmission  (such as Systems analyst and detention facilities, homeless shelters, or cruise ships), CDC recommends a 10-day quarantine for residents, regardless of vaccination and booster status. During periods of critical staffing shortages, facilities may consider shortening the quarantine period for staff to ensure continuity of operations. Decisions to shorten quarantine in these settings should be made in consultation with state, local, tribal, or territorial health departments and should take into consideration the context and characteristics of the facility. CDC's setting-specific guidance provides additional recommendations for these settings. Isolation Isolation is used to separate people with confirmed or suspected COVID-19 from those without COVID-19. People who are in isolation should stay  home until it's safe for them to be around others. At home, anyone sick or infected should separate from others, or wear a well-fitting mask when they need to be around others. People in isolation should stay in a specific "sick room" or area and use a separate bathroom if available. Everyone who has presumed or confirmed COVID-19 should stay home and isolate from other people for at least 5 full days (day 0 is the first day of symptoms or the date of the day of the positive viral test for asymptomatic persons). They should wear a mask when around others at home and in public for an additional 5 days. People who are confirmed to have COVID-19 or are showing symptoms of COVID-19 need to isolate regardless of their vaccination status. This includes: People who have a positive viral test for COVID-19, regardless of whether or not they have symptoms. People with symptoms of COVID-19, including people who are awaiting test results or have not been tested. People with symptoms should isolate even if they do not know if they have been in close contact with someone with COVID-19. What to do for isolation Monitor your symptoms. If you  have an emergency warning sign (including trouble breathing), seek emergency medical care immediately. Stay in a separate room from other household members, if possible. Use a separate bathroom, if possible. Take steps to improve ventilation at home, if possible. Avoid contact with other members of the household and pets. Don't share personal household items, like cups, towels, and utensils. Wear a well-fitting mask when you need to be around other people. Learn more about what to do if you are sick and how to notify your contacts. Ending isolation for people who had COVID-19 and had symptoms If you had COVID-19 and had symptoms, isolate for at least 5 days. To calculate your 5-day isolation period, day 0 is your first day of symptoms. Day 1 is the first full day after your symptoms developed. You can leave isolation after 5 full days. You can end isolation after 5 full days if you are fever-free for 24 hours without the use of fever-reducing medication and your other symptoms have improved (Loss of taste and smell may persist for weeks or months after recovery and need not delay the end of isolation). You should continue to wear a well-fitting mask around others at home and in public for 5 additional days (day 6 through day 10) after the end of your 5-day isolation period. If you are unable to wear a mask when around others, you should continue to isolate for a full 10 days. Avoid people who are immunocompromised or at high risk for severe disease, and nursing homes and other high-risk settings, until after at least 10 days. If you continue to have fever or your other symptoms have not improved after 5 days of isolation, you should wait to end your isolation until you are fever-free for 24 hours without the use of fever-reducing medication and your other symptoms have improved. Continue to wear a well-fitting mask. Contact your healthcare provider if you have questions. See additional information about  travel. Do not go to places where you are unable to wear a mask, such as restaurants and some gyms, and avoid eating around others at home and at work until a full 10 days after your first day of symptoms. If an individual has access to a test and wants to test, the best approach is to use an antigen test1 towards the end of  the 5-day isolation period. Collect the test sample only if you are fever-free for 24 hours without the use of fever-reducing medication and your other symptoms have improved (loss of taste and smell may persist for weeks or months after recovery and need not delay the end of isolation). If your test result is positive, you should continue to isolate until day 10. If your test result is negative, you can end isolation, but continue to wear a well-fitting mask around others at home and in public until day 10. Follow additional recommendations for masking and avoiding travel as described above. 1As noted in the labeling for authorized over-the counter antigen tests: Negative results should be treated as presumptive. Negative results do not rule out SARS-CoV-2 infection and should not be used as the sole basis for treatment or patient management decisions, including infection control decisions. To improve results, antigen tests should be used twice over a three-day period with at least 24 hours and no more than 48 hours between tests. Note that these recommendations on ending isolation do not apply to people with moderate or severe COVID-19 or with weakened immune systems (immunocompromised). See section below for recommendations for when toend isolation for these groups. Ending isolation for people who tested positive for COVID-19 but had no symptoms If you test positive for COVID-19 and never develop symptoms, isolate for at least 5 days. Day 0 is the day of your positive viral test (based on the date you were tested) and day 1 is the first full day after the specimen was collected for your  positive test. You can leave isolation after 5 full days. If you continue to have no symptoms, you can end isolation after at least 5 days. You should continue to wear a well-fitting mask around others at home and in public until day 10 (day 6 through day 10). If you are unable to wear a mask when around others, you should continue to isolate for 10 days. Avoid people who are immunocompromised or at high risk for severe disease, and nursing homes and other high-risk settings, until after at least 10 days. If you develop symptoms after testing positive, your 5-day isolation period should start over. Day 0 is your first day of symptoms. Follow the recommendations above for ending isolation for people who had COVID-19 and had symptoms. See additional information about travel. Do not go to places where you are unable to wear a mask, such as restaurants and some gyms, and avoid eating around others at home and at work until 10 days after the day of your positive test. If an individual has access to a test and wants to test, the best approach is to use an antigen test1 towards the end of the 5-day isolation period. If your test result is positive, you should continue to isolate until day 10. If your test result is negative, you can end isolation, but continue to wear a well-fitting mask around others at home and in public until day 10. Follow additionalrecommendations for masking and avoiding travel as described above. 1As noted in the labeling for authorized over-the counter antigen tests: Negative results should be treated as presumptive. Negative results do not rule out SARS-CoV-2 infection and should not be used as the sole basis for treatment or patient management decisions, including infection control decisions. To improve results, antigen tests should be used twice over a three-day period with at least 24 hours and no more than 48 hours between tests. Ending isolation for people who were  severely ill with  COVID-19 or have a weakened immune system (immunocompromised) People who are severely ill with COVID-19 (including those who were hospitalized or required intensive care or ventilation support) and people with compromised immune systems might need to isolate at home longer. They may also require testing with a viral test to determine when they can be around others. CDC recommends an isolation period of at least 10 and up to 20 days for people who were severely ill with COVID-19 and for people with weakened immune systems. Consult with your healthcare provider about when you can resume being aroundother people. People who are immunocompromised should talk to their healthcare provider about the potential for reduced immune responses to COVID-19 vaccines and the need to continue to follow current prevention measures (including wearing a well-fitting mask, staying 6 feet apart from others they don't live with, and avoiding crowds and poorly ventilated indoor spaces) to protect themselves against COVID-19 until advised otherwise by their healthcare provider. Close contacts of immunocompromised people--including household members--should also be encouraged to receive all recommended COVID-19 vaccine doses to help protect these people. Isolation in high-risk congregate settings In certain high-risk congregate settings that have high risk of secondary transmission and where it is not feasible to cohort people (such as Systems analyst and detention facilities, homeless shelters, and cruise ships), CDC recommends a 10-day isolation period for residents. During periods of critical staffing shortages, facilities may consider shortening the isolation period for staff to ensure continuity of operations. Decisions to shorten isolation in these settings should be made in consultation with state, local, tribal, or territorial health departments and should take into consideration the context and characteristics of the facility.  CDC's setting-specific guidance provides additional recommendations for these settings. This CDC guidance is meant to supplement--not replace--any federal, state, local,territorial, or tribal health and safety laws, rules, and regulations. Recommendations for specific settings These recommendations do not apply to healthcare professionals. For guidance specific to these settings, see Healthcare professionals: Interim Guidance for Optician, dispensing with SARS-CoV-2 Infection or Exposure to SARS-CoV-2 Patients, residents, and visitors to healthcare settings: Interim Infection Prevention and Control Recommendations for Healthcare Personnel During the [place] 2019 (COVID-19) Pandemic Additional setting-specific guidance and recommendations are available. These recommendations on quarantine and isolation do apply to [facility] settings. Additional guidance is available here: Overview of COVID-19 Quarantine for K-12 Schools Travelers: Travel information and recommendations Congregate facilities and other settings: [employer] for community, work, and school settings Ongoing COVID-19 exposure FAQs I live with someone with COVID-19, but I cannot be separated from them. How do we manage quarantine in this situation? It is very important for people with COVID-19 to remain apart from other people, if possible, even if they are living together. If separation of the person with COVID-19 from others that they live with is not possible, the other people that they live with will have ongoing exposure, meaning they will be repeatedly exposed until that person is no longer able to spread the virus to other people. In this situation, there are precautions you can take to limit the spread of COVID-19: The person with COVID-19 and everyone they live with should wear a well-fitting mask inside the home. If possible, one person should care for the person with COVID-19 to limit the number of people  who are in close contact with the infected person. Take steps to protect yourself and others to reduce transmission in the home: Quarantine if you are not up to date with your COVID-19  vaccines. Isolate if you are sick or tested positive for COVID-19, even if you don't have symptoms. Learn more about the public health recommendations for testing, mask use and quarantine of close contacts, like yourself, who have ongoing exposure. These recommendations differ depending on your vaccination status. What should I do if I have ongoing exposure to COVID-19 from someone I live with? Recommendations for this situation depend on your vaccination status: If you are not up to date on COVID-19 vaccines and have ongoing exposure to COVID-19, you should: Begin quarantine immediately and continue to quarantine throughout the isolation period of the person with COVID-19. Continue to quarantine for an additional 5 days starting the day after the end of isolation for the person with COVID-19. Get tested at least 5 days after the end of isolation of the infected person that lives with them. If you test negative, you can leave the home but should continue to wear a well-fitting mask when around others at home and in public until 10 days after the end of isolation for the person with COVID-19. Isolate immediately if you develop symptoms of COVID-19 or test positive. If you are up to date with COVID-19 vaccines and have ongoing exposure to COVID-19, you should: Get tested at least 5 days after your first exposure. A person with COVID-19 is considered infectious starting 2 days before they develop symptoms, or 2 days before the date of their positive test if they do not have symptoms. Get tested again at least 5 days after the end of isolation for the person with COVID-19. Wear a well-fitting mask when you are around the person with COVID-19, and do this throughout their isolation period. Wear a well-fitting mask around  others for 10 days after the infected person's isolation period ends. Isolate immediately if you develop symptoms of COVID-19 or test positive. What should I do if multiple people I live with test positive for COVID-19 at different times? Recommendations for this situation depend on your vaccination status: If you are not up to date with your COVID-19 vaccines, you should: Quarantine throughout the isolation period of any infected person that you live with. Continue to quarantine until 5 days after the end of isolation date for the most recently infected person that lives with you. For example, if the last day of isolation of the person most recently infected with COVID-19 was June 30, the new 5-day quarantine period starts on July 1. Get tested at least 5 days after the end of isolation for the most recently infected person that lives with you. Wear a well-fitting mask when you are around any person with COVID-19 while that person is in isolation. Wear a well-fitting mask when you are around other people until 10 days after your last close contact. Isolate immediately if you develop symptoms of COVID-19 or test positive. If you are up to date with COVID-19 your vaccines , you should: Get tested at least 5 days after your first exposure. A person with COVID-19 is considered infectious starting 2 days before they developed symptoms, or 2 days before the date of their positive test if they do not have symptoms. Get tested again at least 5 days after the end of isolation for the most recently infected person that lives with you. Wear a well-fitting mask when you are around any person with COVID-19 while that person is in isolation. Wear a well-fitting mask around others for 10 days after the end of isolation for the most recently infected person that  lives with you. For example, if the last day of isolation for the person most recently infected with COVID-19 was June 30, the new 10-day period to wear a  well-fitting mask indoors in public starts on July 1. Isolate immediately if you develop symptoms of COVID-19 or test positive. I had COVID-19 and completed isolation. Do I have to quarantine or get tested if someone I live with gets COVID-19 shortly after I completed isolation? No. If you recently completed isolation and someone that lives with you tests positive for the virus that causes COVID-19 shortly after the end of your isolation period, you do not have to quarantine or get tested as long as you do not develop new symptoms. Once all of the people that live together have completed isolation or quarantine, refer to the guidance below for new exposures to COVID-19. If you had COVID-19 in the previous 90 days and then came into close contact with someone with COVID-19, you do not have to quarantine or get tested if you do not have symptoms. But you should: Wear a well-fitting mask indoors in public for 10 days after exposure. Monitor for COVID-19 symptoms and isolate immediately if symptoms develop. Consult with a healthcare provider for testing recommendations if new symptoms develop. If more than 90 days have passed since your recovery from infection, follow CDC's recommendations for close contacts. These recommendations will differ depending on your vaccination status. 03/27/2020 Content source: [facility] ([facility]), Division of Viral Diseases This information is not intended to replace advice given to you by your health care provider. Make sure you discuss any questions you have with your healthcare provider. Document Revised: 04/04/2020 Document Reviewed: 04/04/2020 Elsevier Patient Education  [place] Can Do to Manage Your COVID-19 Symptoms at Home If you have possible or confirmed COVID-19 Stay home except to get medical care. Monitor your symptoms carefully. If your symptoms get worse, call your healthcare provider  immediately. Get rest and stay hydrated. If you have a medical appointment, call the healthcare provider ahead of time and tell them that you have or may have COVID-19. For medical emergencies, call 911 and notify the dispatch personnel that you have or may have COVID-19. Cover your cough and sneezes with a tissue or use the inside of your elbow. Wash your hands often with soap and water for at least 20 seconds or clean your hands with an alcohol-based hand sanitizer that contains at least 60% alcohol. As much as possible, stay in a specific room and away from other people in your home. Also, you should use a separate bathroom, if available. If you need to be around other people in or outside of the home, wear a mask. Avoid sharing personal items with other people in your household, like dishes, towels, and bedding. Clean all surfaces that are touched often, like counters, tabletops, and doorknobs. Use household cleaning sprays or wipes according to the label instructions. [email or web address] 09/14/2019 This information is not intended to replace advice given to you by your health care provider. Make sure you discuss any questions you have with your healthcare provider. Document Revised: 04/04/2020 Document Reviewed: 04/04/2020 Elsevier Patient Education  Caitlyn Thomas.

## 2020-08-13 ENCOUNTER — Encounter: Payer: Self-pay | Admitting: Physician Assistant

## 2020-09-15 IMAGING — US ULTRASOUND LEFT BREAST LIMITED
1 series · 7 of 7 positions shown · non-contrast
Comparison: Screening study, 09/22/2018.
COMPARISON: Screening study, 09/22/2018.

Addendum:
CLINICAL DATA: Screening recall for a possible asymmetry with
possible associated calcifications in the posterior left breast.

EXAM:
DIGITAL DIAGNOSTIC LEFT MAMMOGRAM WITH CAD AND TOMO
ULTRASOUND LEFT BREAST

[Series 1: ultrasound left breast limited · 0.06mm/px · 7 of 7 slices shown]
[im 1/7]
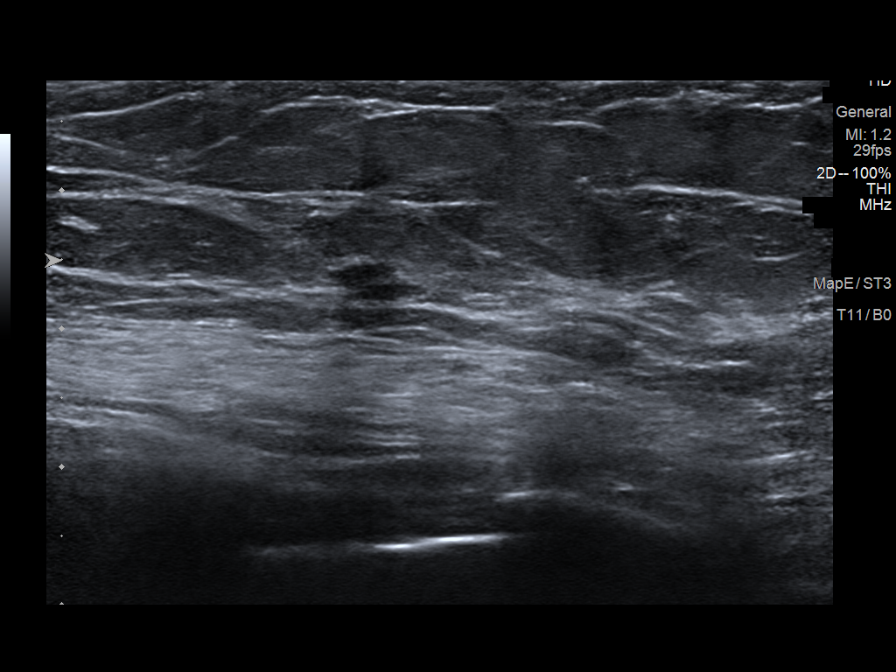
[im 2/7]
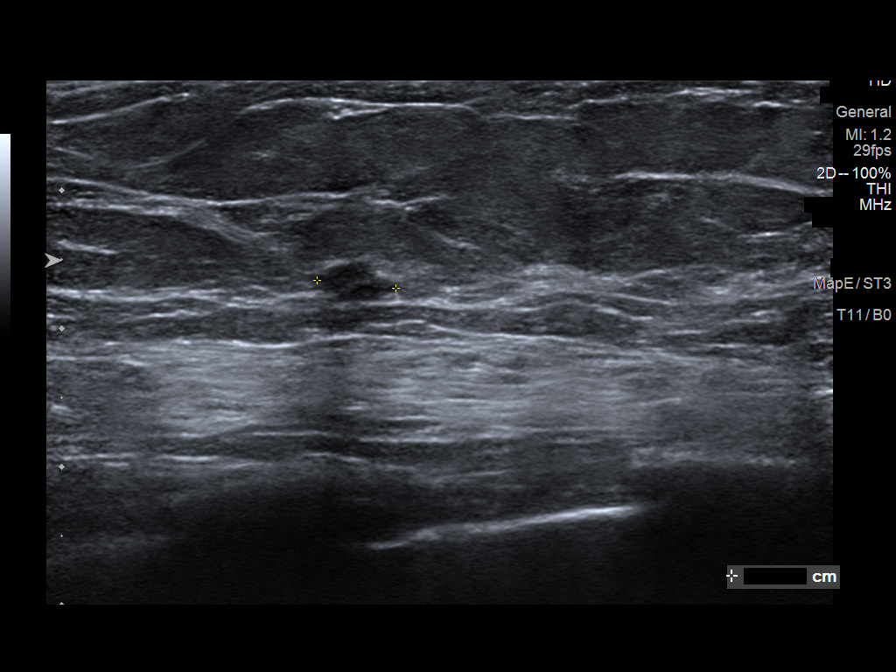
[im 3/7]
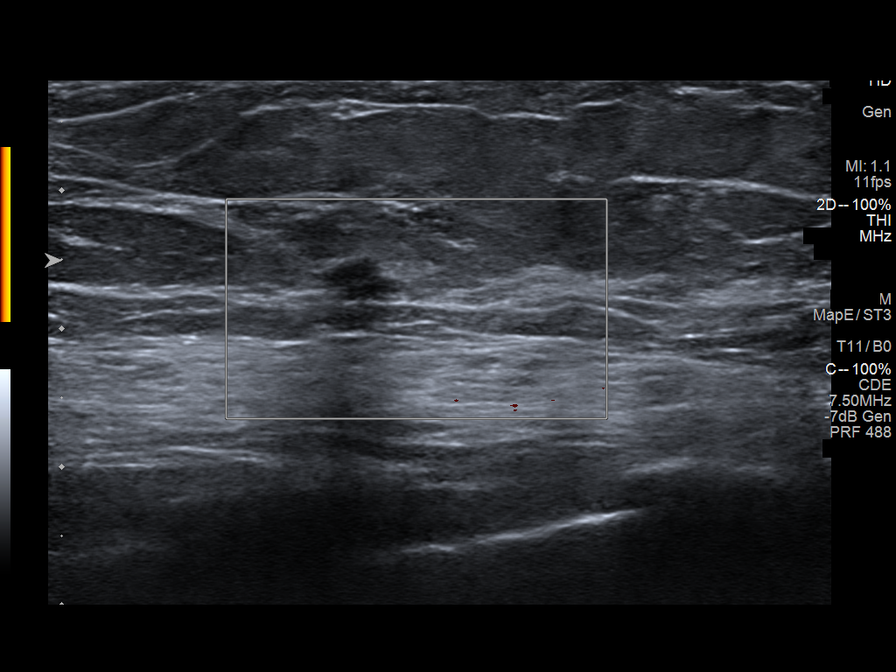
[im 4/7]
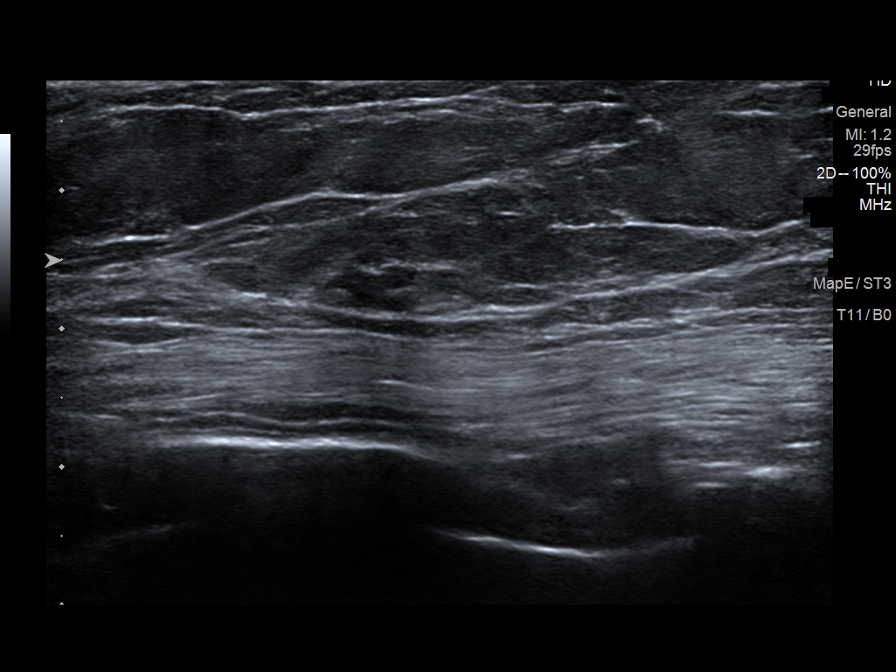
[im 5/7]
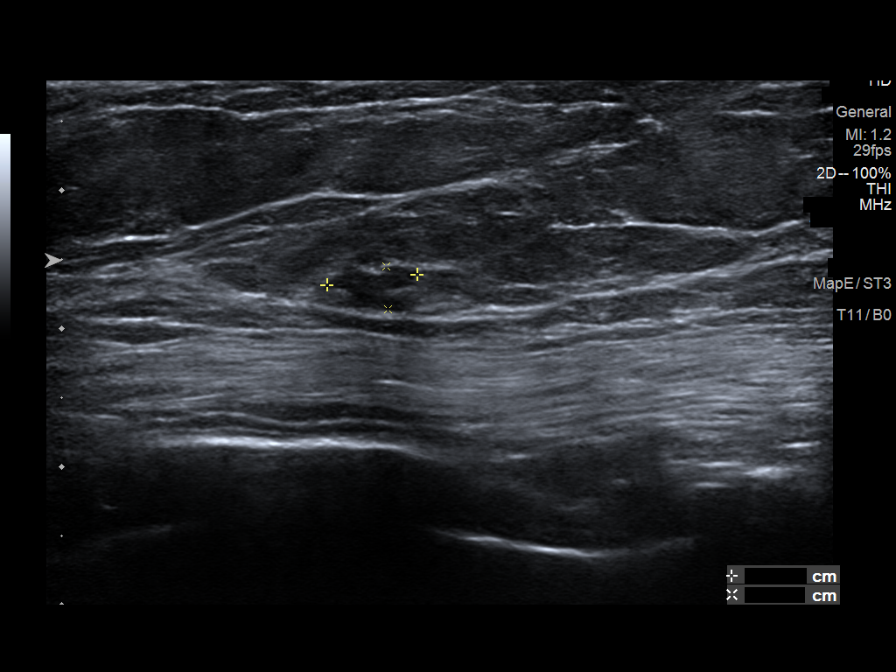
[im 6/7]
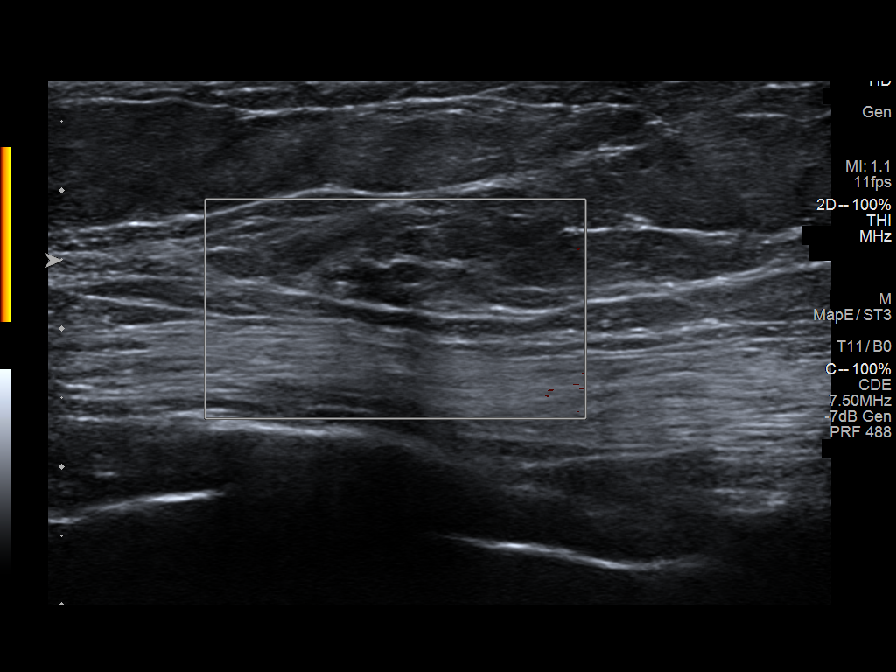
[im 7/7]
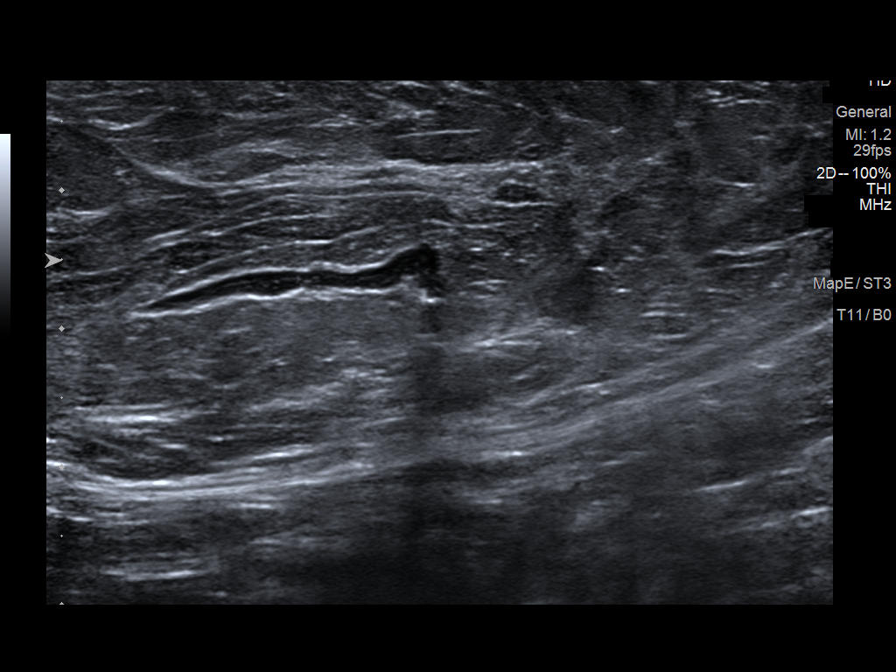

[7 of 7 positions shown; findings below may reference images not displayed]

There are no available
older exams.

ACR Breast Density Category c: The breast tissue is heterogeneously
dense, which may obscure small masses.
FINDINGS: The asymmetry noted in the posterior left breast persists on the
diagnostic images. It is seen slightly medial and posterior on the
spot compression CC view projecting in the upper inner quadrant
posteriorly based on the spot-compression MLO views. Subtle punctate
calcifications are associated with the mass. Mass measures
approximately 11 mm in long axis.

There are no other masses. There are no areas of architectural
distortion.

Mammographic images were processed with CAD.

On physical exam, no mass is palpated in the medial left breast.

Targeted ultrasound is performed, showing a hypoechoic lobulated
mass in the left breast at 10:30 o'clock, 7 cm the nipple, posterior
depth, with no associated distortion or convincing calcifications.
Mass measures 7 x 3 x 6 mm.

There is heterogeneous fibroglandular tissue in the medial and upper
inner left breast closer to the nipple, but no other defined masses.

Sonographic evaluation of the left axilla shows no enlarged or
abnormal lymph nodes.
IMPRESSION: 1. Masslike mammographic asymmetry in the posterior, medial aspect
of the left breast with apparent fine to punctate associated
calcifications. Ultrasound shows a 7 mm lobulated hypoechoic mass in
the posterior breast at 10:30 o'clock, 7 cm the nipple, which likely
corresponds to the mammographic finding.

RECOMMENDATION:
1. Ultrasound-guided core needle biopsy the 10:30 o'clock position
right breast mass was recommended. However, this patient reports
that she is losing her medical insurance tomorrow and will not be
able to afford to have a biopsy. After discussion with patient, it
was decided to proceed with a six-month, short-term follow-up to
reassess the mammographic and ultrasound abnormality. Therefore,
left breast diagnostic mammography and ultrasound in 6 months will
be scheduled.

I have discussed the findings and recommendations with the patient.
Results were also provided in writing at the conclusion of the
visit. If applicable, a reminder letter will be sent to the patient
regarding the next appointment.

BI-RADS CATEGORY  4: Suspicious.

ADDENDUM:
Laterality error in the recommendation section. The initial
recommendation for biopsy with the alternate plan being a six-month
follow-up is for the left breast, not the right.

*** End of Addendum ***
There are no available
older exams.

ACR Breast Density Category c: The breast tissue is heterogeneously
dense, which may obscure small masses.
FINDINGS: The asymmetry noted in the posterior left breast persists on the
diagnostic images. It is seen slightly medial and posterior on the
spot compression CC view projecting in the upper inner quadrant
posteriorly based on the spot-compression MLO views. Subtle punctate
calcifications are associated with the mass. Mass measures
approximately 11 mm in long axis.

There are no other masses. There are no areas of architectural
distortion.

Mammographic images were processed with CAD.

On physical exam, no mass is palpated in the medial left breast.

Targeted ultrasound is performed, showing a hypoechoic lobulated
mass in the left breast at 10:30 o'clock, 7 cm the nipple, posterior
depth, with no associated distortion or convincing calcifications.
Mass measures 7 x 3 x 6 mm.

There is heterogeneous fibroglandular tissue in the medial and upper
inner left breast closer to the nipple, but no other defined masses.

Sonographic evaluation of the left axilla shows no enlarged or
abnormal lymph nodes.
IMPRESSION: 1. Masslike mammographic asymmetry in the posterior, medial aspect
of the left breast with apparent fine to punctate associated
calcifications. Ultrasound shows a 7 mm lobulated hypoechoic mass in
the posterior breast at 10:30 o'clock, 7 cm the nipple, which likely
corresponds to the mammographic finding.

RECOMMENDATION:
1. Ultrasound-guided core needle biopsy the 10:30 o'clock position
right breast mass was recommended. However, this patient reports
that she is losing her medical insurance tomorrow and will not be
able to afford to have a biopsy. After discussion with patient, it
was decided to proceed with a six-month, short-term follow-up to
reassess the mammographic and ultrasound abnormality. Therefore,
left breast diagnostic mammography and ultrasound in 6 months will
be scheduled.

I have discussed the findings and recommendations with the patient.
Results were also provided in writing at the conclusion of the
visit. If applicable, a reminder letter will be sent to the patient
regarding the next appointment.

BI-RADS CATEGORY  4: Suspicious.

## 2020-09-15 IMAGING — MG DIGITAL DIAGNOSTIC UNILATERAL LEFT MAMMOGRAM WITH TOMO AND CAD
7 series · 7 of 15 positions shown · non-contrast
Comparison: Screening study, 09/22/2018.
COMPARISON: Screening study, 09/22/2018.

Addendum:
CLINICAL DATA: Screening recall for a possible asymmetry with
possible associated calcifications in the posterior left breast.

EXAM:
DIGITAL DIAGNOSTIC LEFT MAMMOGRAM WITH CAD AND TOMO
ULTRASOUND LEFT BREAST

[L MLO]
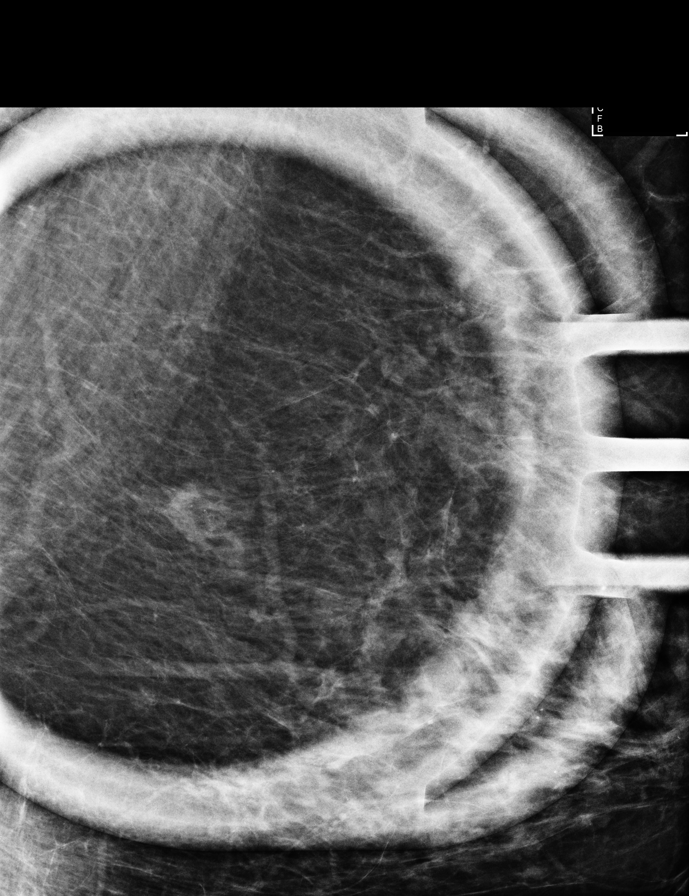

[L CC (1 of 2)]
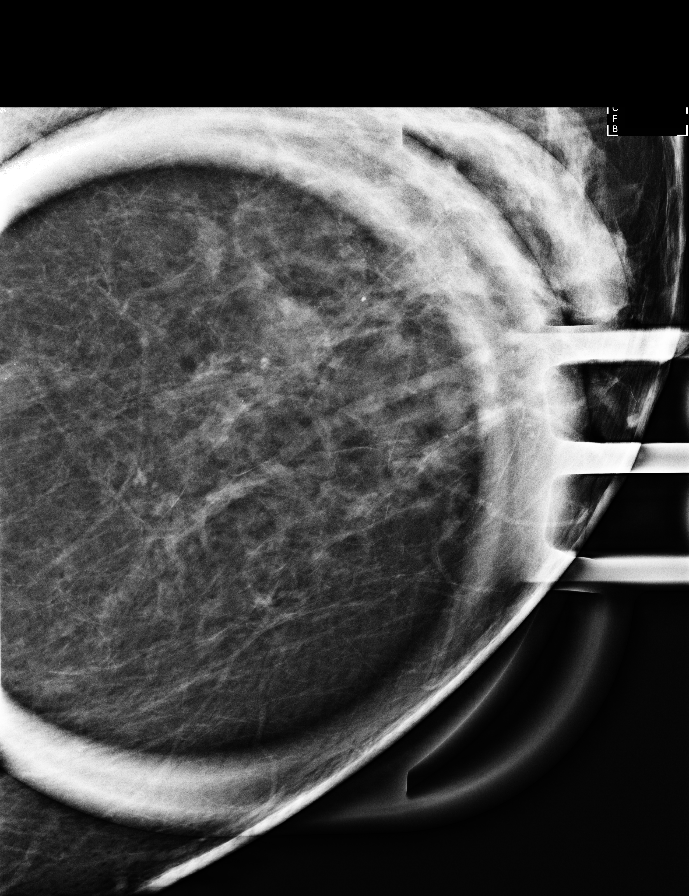

[L CC (2 of 2)]
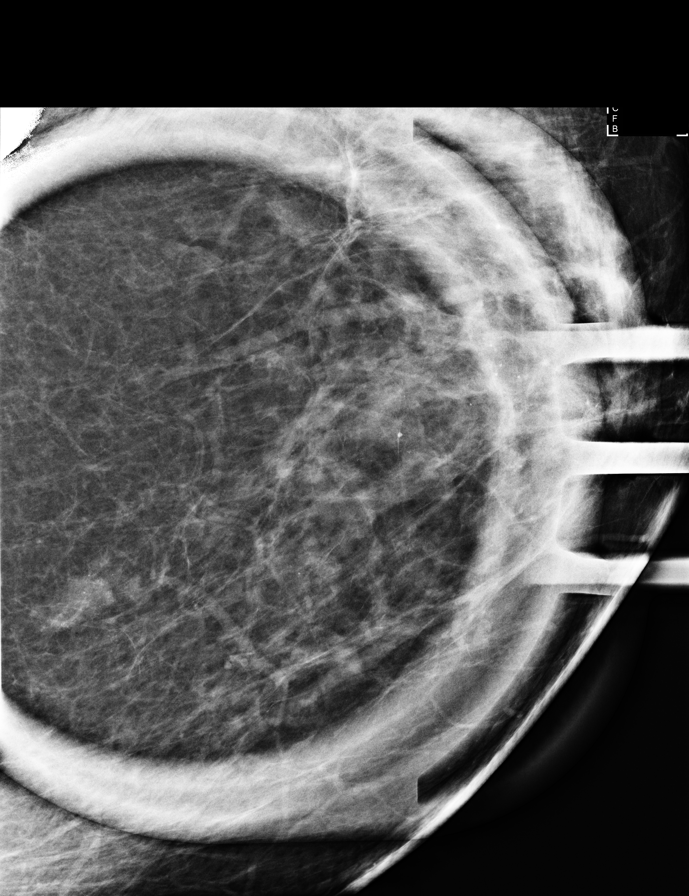

[L MLO synth-2D]
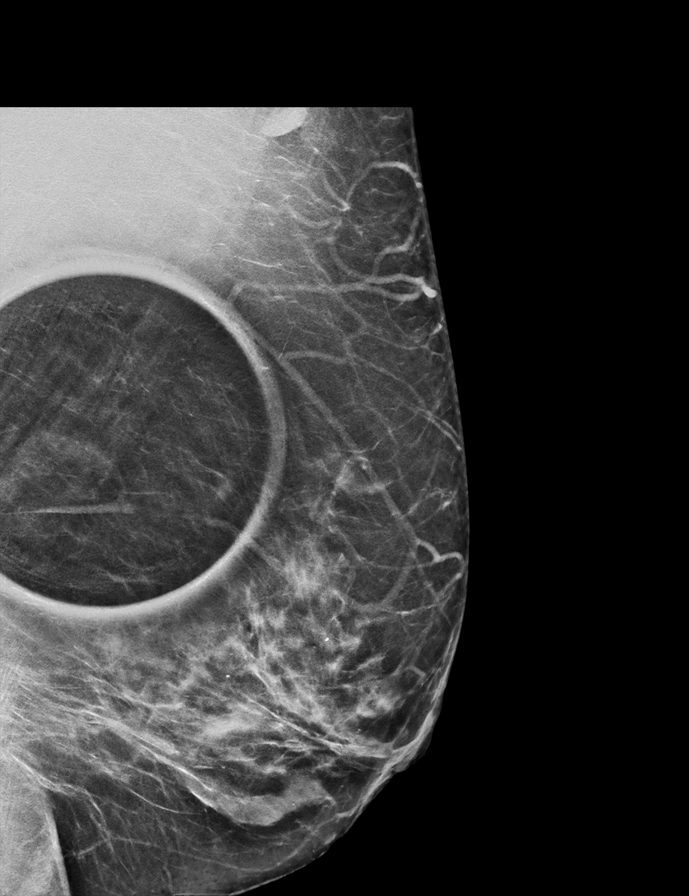

[L CC synth-2D]
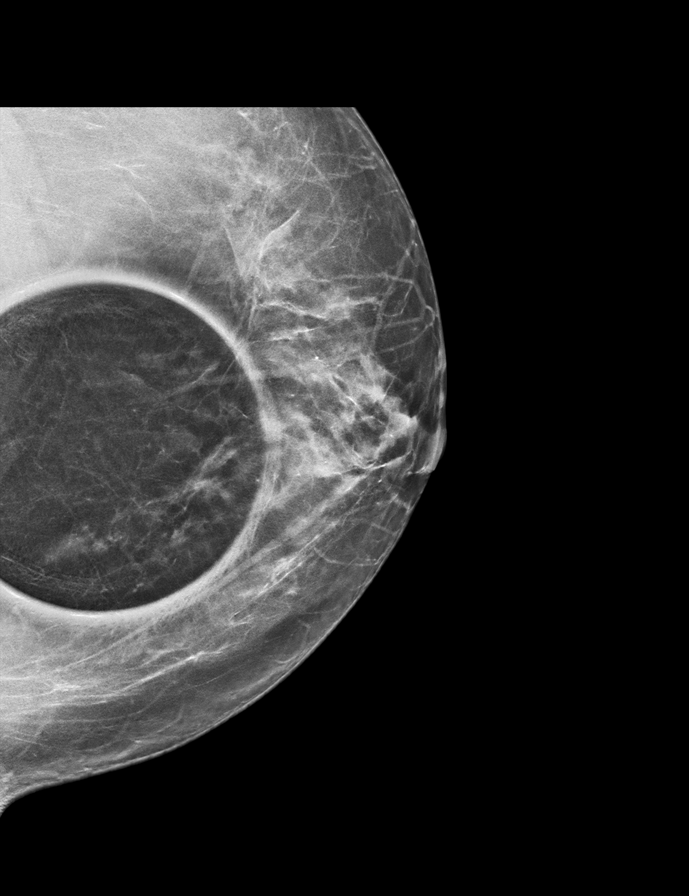

[L MLO tomo · tomo slice 33/64.0]
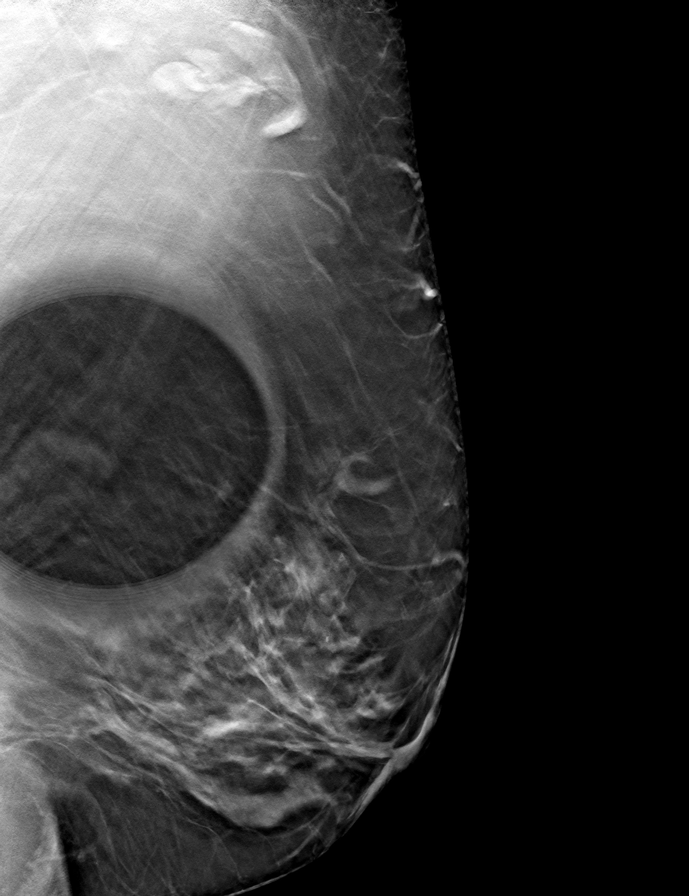

[L CC tomo · tomo slice 29/56.0]
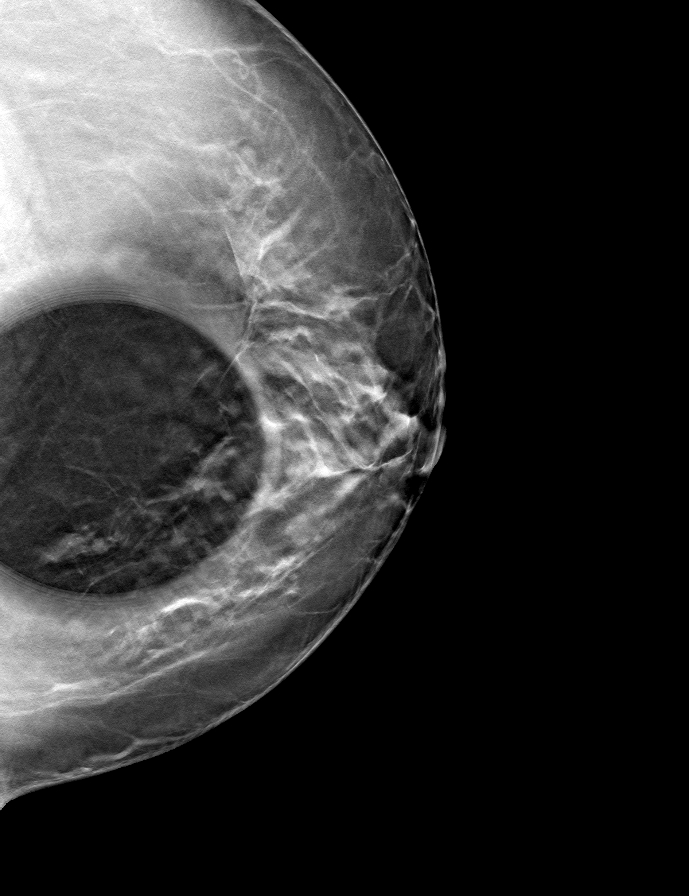

[7 of 15 positions shown; findings below may reference images not displayed]

There are no available
older exams.

ACR Breast Density Category c: The breast tissue is heterogeneously
dense, which may obscure small masses.
FINDINGS: The asymmetry noted in the posterior left breast persists on the
diagnostic images. It is seen slightly medial and posterior on the
spot compression CC view projecting in the upper inner quadrant
posteriorly based on the spot-compression MLO views. Subtle punctate
calcifications are associated with the mass. Mass measures
approximately 11 mm in long axis.

There are no other masses. There are no areas of architectural
distortion.

Mammographic images were processed with CAD.

On physical exam, no mass is palpated in the medial left breast.

Targeted ultrasound is performed, showing a hypoechoic lobulated
mass in the left breast at 10:30 o'clock, 7 cm the nipple, posterior
depth, with no associated distortion or convincing calcifications.
Mass measures 7 x 3 x 6 mm.

There is heterogeneous fibroglandular tissue in the medial and upper
inner left breast closer to the nipple, but no other defined masses.

Sonographic evaluation of the left axilla shows no enlarged or
abnormal lymph nodes.
IMPRESSION: 1. Masslike mammographic asymmetry in the posterior, medial aspect
of the left breast with apparent fine to punctate associated
calcifications. Ultrasound shows a 7 mm lobulated hypoechoic mass in
the posterior breast at 10:30 o'clock, 7 cm the nipple, which likely
corresponds to the mammographic finding.

RECOMMENDATION:
1. Ultrasound-guided core needle biopsy the 10:30 o'clock position
right breast mass was recommended. However, this patient reports
that she is losing her medical insurance tomorrow and will not be
able to afford to have a biopsy. After discussion with patient, it
was decided to proceed with a six-month, short-term follow-up to
reassess the mammographic and ultrasound abnormality. Therefore,
left breast diagnostic mammography and ultrasound in 6 months will
be scheduled.

I have discussed the findings and recommendations with the patient.
Results were also provided in writing at the conclusion of the
visit. If applicable, a reminder letter will be sent to the patient
regarding the next appointment.

BI-RADS CATEGORY  4: Suspicious.

ADDENDUM:
Laterality error in the recommendation section. The initial
recommendation for biopsy with the alternate plan being a six-month
follow-up is for the left breast, not the right.

*** End of Addendum ***
There are no available
older exams.

ACR Breast Density Category c: The breast tissue is heterogeneously
dense, which may obscure small masses.
FINDINGS: The asymmetry noted in the posterior left breast persists on the
diagnostic images. It is seen slightly medial and posterior on the
spot compression CC view projecting in the upper inner quadrant
posteriorly based on the spot-compression MLO views. Subtle punctate
calcifications are associated with the mass. Mass measures
approximately 11 mm in long axis.

There are no other masses. There are no areas of architectural
distortion.

Mammographic images were processed with CAD.

On physical exam, no mass is palpated in the medial left breast.

Targeted ultrasound is performed, showing a hypoechoic lobulated
mass in the left breast at 10:30 o'clock, 7 cm the nipple, posterior
depth, with no associated distortion or convincing calcifications.
Mass measures 7 x 3 x 6 mm.

There is heterogeneous fibroglandular tissue in the medial and upper
inner left breast closer to the nipple, but no other defined masses.

Sonographic evaluation of the left axilla shows no enlarged or
abnormal lymph nodes.
IMPRESSION: 1. Masslike mammographic asymmetry in the posterior, medial aspect
of the left breast with apparent fine to punctate associated
calcifications. Ultrasound shows a 7 mm lobulated hypoechoic mass in
the posterior breast at 10:30 o'clock, 7 cm the nipple, which likely
corresponds to the mammographic finding.

RECOMMENDATION:
1. Ultrasound-guided core needle biopsy the 10:30 o'clock position
right breast mass was recommended. However, this patient reports
that she is losing her medical insurance tomorrow and will not be
able to afford to have a biopsy. After discussion with patient, it
was decided to proceed with a six-month, short-term follow-up to
reassess the mammographic and ultrasound abnormality. Therefore,
left breast diagnostic mammography and ultrasound in 6 months will
be scheduled.

I have discussed the findings and recommendations with the patient.
Results were also provided in writing at the conclusion of the
visit. If applicable, a reminder letter will be sent to the patient
regarding the next appointment.

BI-RADS CATEGORY  4: Suspicious.
